# Patient Record
Sex: Male | Born: 1980 | Race: Black or African American | Hispanic: No | Marital: Married | Smoking: Never smoker
Health system: Southern US, Community
[De-identification: ages and names within clinical notes are randomized; demographics above are authoritative.]

## PROBLEM LIST (undated history)

## (undated) DIAGNOSIS — Z227 Latent tuberculosis: Secondary | ICD-10-CM

## (undated) DIAGNOSIS — I10 Essential (primary) hypertension: Secondary | ICD-10-CM

## (undated) DIAGNOSIS — G5600 Carpal tunnel syndrome, unspecified upper limb: Secondary | ICD-10-CM

## (undated) DIAGNOSIS — I313 Pericardial effusion (noninflammatory): Secondary | ICD-10-CM

## (undated) DIAGNOSIS — I3139 Other pericardial effusion (noninflammatory): Secondary | ICD-10-CM

## (undated) DIAGNOSIS — K219 Gastro-esophageal reflux disease without esophagitis: Secondary | ICD-10-CM

## (undated) HISTORY — PX: COLONOSCOPY: SHX174

## (undated) HISTORY — DX: Carpal tunnel syndrome, unspecified upper limb: G56.00

## (undated) HISTORY — PX: PERICARDIOCENTESIS: SHX2215

---

## 2011-01-27 HISTORY — PX: UPPER GASTROINTESTINAL ENDOSCOPY: SHX188

## 2011-10-10 ENCOUNTER — Encounter (HOSPITAL_BASED_OUTPATIENT_CLINIC_OR_DEPARTMENT_OTHER): Payer: Self-pay | Admitting: *Deleted

## 2011-10-10 ENCOUNTER — Emergency Department (HOSPITAL_BASED_OUTPATIENT_CLINIC_OR_DEPARTMENT_OTHER): Payer: 59

## 2011-10-10 ENCOUNTER — Emergency Department (HOSPITAL_BASED_OUTPATIENT_CLINIC_OR_DEPARTMENT_OTHER)
Admission: EM | Admit: 2011-10-10 | Discharge: 2011-10-10 | Disposition: A | Discharge status: Other Acute Inpatient Hospital | Payer: 59 | Attending: Emergency Medicine | Admitting: Emergency Medicine

## 2011-10-10 DIAGNOSIS — I319 Disease of pericardium, unspecified: Secondary | ICD-10-CM | POA: Insufficient documentation

## 2011-10-10 DIAGNOSIS — I313 Pericardial effusion (noninflammatory): Secondary | ICD-10-CM

## 2011-10-10 DIAGNOSIS — IMO0001 Reserved for inherently not codable concepts without codable children: Secondary | ICD-10-CM

## 2011-10-10 DIAGNOSIS — R0602 Shortness of breath: Secondary | ICD-10-CM | POA: Insufficient documentation

## 2011-10-10 LAB — CBC WITH DIFFERENTIAL/PLATELET
Eosinophils Absolute: 0.2 10*3/uL (ref 0.0–0.7)
Eosinophils Relative: 3 % (ref 0–5)
HCT: 34.2 % — ABNORMAL LOW (ref 39.0–52.0)
Lymphocytes Relative: 24 % (ref 12–46)
Lymphs Abs: 1.8 10*3/uL (ref 0.7–4.0)
MCH: 29.7 pg (ref 26.0–34.0)
MCV: 92.4 fL (ref 78.0–100.0)
Monocytes Absolute: 0.9 10*3/uL (ref 0.1–1.0)
Monocytes Relative: 12 % (ref 3–12)
Platelets: 444 10*3/uL — ABNORMAL HIGH (ref 150–400)
RBC: 3.7 MIL/uL — ABNORMAL LOW (ref 4.22–5.81)
WBC: 7.2 10*3/uL (ref 4.0–10.5)

## 2011-10-10 LAB — BASIC METABOLIC PANEL
BUN: 9 mg/dL (ref 6–23)
CO2: 27 mEq/L (ref 19–32)
Calcium: 9.1 mg/dL (ref 8.4–10.5)
Creatinine, Ser: 1 mg/dL (ref 0.50–1.35)
GFR calc non Af Amer: >90 mL/min (ref >90)
Glucose, Bld: 98 mg/dL (ref 70–99)
Sodium: 139 mEq/L (ref 135–145)

## 2011-10-10 LAB — URINALYSIS, ROUTINE W REFLEX MICROSCOPIC
Bilirubin Urine: NEGATIVE
Glucose, UA: NEGATIVE mg/dL
Hgb urine dipstick: NEGATIVE
Specific Gravity, Urine: 1.018 (ref 1.005–1.030)
Urobilinogen, UA: 1 mg/dL (ref 0.0–1.0)

## 2011-10-10 LAB — PHOSPHORUS: Phosphorus: 4.4 mg/dL (ref 2.3–4.6)

## 2011-10-10 MED ORDER — IOHEXOL 300 MG/ML  SOLN
20.0000 mL | INTRAMUSCULAR | Status: AC
  Administered 2011-10-10: 20 mL via ORAL

## 2011-10-10 MED ORDER — SODIUM CHLORIDE 0.9 % IV SOLN: Freq: Once | INTRAVENOUS | Status: DC

## 2011-10-10 MED ORDER — PANTOPRAZOLE SODIUM 40 MG PO TBEC
40.0000 mg | DELAYED_RELEASE_TABLET | Freq: Once | ORAL | Status: AC
  Administered 2011-10-10: 40 mg via ORAL
  Filled 2011-10-10: qty 1

## 2011-10-10 MED ORDER — IOHEXOL 300 MG/ML  SOLN
100.0000 mL | Freq: Once | INTRAMUSCULAR | Status: AC | PRN
  Administered 2011-10-10: 100 mL via INTRAVENOUS

## 2011-10-10 MED ORDER — SODIUM CHLORIDE 0.9 % IV BOLUS (SEPSIS)
1000.0000 mL | Freq: Once | INTRAVENOUS | Status: AC
  Administered 2011-10-10: 1000 mL via INTRAVENOUS

## 2011-10-10 NOTE — ED Provider Notes (Addendum)
History  This chart was scribed for Derwood Kaplan, MD by Shari Heritage. The patient was seen in room MH04/MH04. Patient's care was started at 1606.     CSN: [id number]  Arrival date & time 10/10/11  1503   First MD Initiated Contact with Patient 10/10/11 1606      Chief Complaint  Patient presents with  . Abdominal Pain    Patient is a 31 y.o. male presenting with abdominal pain. The history is provided by the patient. No language interpreter was used.  Abdominal Pain The primary symptoms of the illness include abdominal pain and shortness of breath. The primary symptoms of the illness do not include nausea, vomiting or diarrhea. The current episode started more than 2 days ago. The onset of the illness was sudden. The problem has not changed since onset. The abdominal pain began more than 2 days ago. The pain came on suddenly. The abdominal pain has been unchanged since its onset. The abdominal pain is located in the epigastric region and RUQ. The abdominal pain is exacerbated by eating and movement.  Additional symptoms associated with the illness include constipation. Symptoms associated with the illness do not include chills. Significant associated medical issues do not include gallstones or substance abuse.    Trevor Lewis is a 31 y.o. male who presents to the Emergency Department complaining of intermittent, sharp, epigastric and RUQ abdominal pain onset 6 days ago. There is associated shortness of breath, non-productive cough and constipation. Patient says that the pain is worse with palpation, when he eats and when he walks. Patient says that he feels full when small amounts of food. He denies nausea, vomiting, chills or diarrhea. His last BM was earlier this morning. Patient says that he was seen at [facility] in [place] and worked up for possible gall bladder disease, but all results and scans were normal. He was discharged with prescriptions for Dicyclomine 10 mg, Sucralfate 1  gm, and Omeprazole 40 mg. Patient denies any significant past medical or surgical history. Patient is from [place] and has been in the [place] 8 years. He denies any recent travel to [place] or other countries. Patient does not smoke or drink.  PCP - [facility]  History reviewed. No pertinent past medical history.  History reviewed. No pertinent past surgical history.  History reviewed. No pertinent family history.  History  Substance Use Topics  . Smoking status: Never Smoker   . Smokeless tobacco: Not on file  . Alcohol Use: No      Review of Systems  Constitutional: Negative for chills.  Respiratory: Positive for cough and shortness of breath.   Gastrointestinal: Positive for abdominal pain and constipation. Negative for nausea, vomiting and diarrhea.  All other systems reviewed and are negative.    Allergies  Review of patient's allergies indicates no known allergies.  Home Medications  No current outpatient prescriptions on file.  BP 130/87  Pulse 102  Temp 98.5 F (36.9 C) (Oral)  Resp 20  Ht 5\' 10"  (1.778 m)  Wt 210 lb (95.255 kg)  BMI 30.13 kg/m2  SpO2 98%  Physical Exam  Constitutional: He is oriented to person, place, and time. He appears well-developed and well-nourished.  HENT:  Head: Normocephalic and atraumatic.  Cardiovascular: Normal rate and regular rhythm.   No murmur heard. Pulmonary/Chest: Effort normal and breath sounds normal. No respiratory distress. He has no wheezes. He has no rales.       Lungs clear to auscultation.  Abdominal: Soft. Bowel  sounds are normal. He exhibits no distension. There is no tenderness. There is no rebound.  Musculoskeletal: Normal range of motion.  Neurological: He is alert and oriented to person, place, and time.  Skin: Skin is warm and dry.  Psychiatric: He has a normal mood and affect. His behavior is normal.    ED Course  Procedures (including critical care time) DIAGNOSTIC STUDIES: Oxygen  Saturation is 98% on room air, normal by my interpretation.    COORDINATION OF CARE: 4:21pm- Patient informed of current plan for treatment and evaluation and agrees with plan at this time. Will order X-rays of chest and abdomen. Recommended that patient continue to take prescribed medication. Will refer patient to on-call GI physicians.   Results for orders placed during the hospital encounter of 10/10/11  URINALYSIS, ROUTINE W REFLEX MICROSCOPIC      Component Value Range   Color, Urine YELLOW  YELLOW   APPearance CLEAR  CLEAR   Specific Gravity, Urine 1.018  1.005 - 1.030   pH 6.5  5.0 - 8.0   Glucose, UA NEGATIVE  NEGATIVE mg/dL   Hgb urine dipstick NEGATIVE  NEGATIVE   Bilirubin Urine NEGATIVE  NEGATIVE   Ketones, ur NEGATIVE  NEGATIVE mg/dL   Protein, ur NEGATIVE  NEGATIVE mg/dL   Urobilinogen, UA 1.0  0.0 - 1.0 mg/dL   Nitrite NEGATIVE  NEGATIVE   Leukocytes, UA NEGATIVE  NEGATIVE    No results found.   No diagnosis found.    MDM  Medical screening examination/treatment/procedure(s) were performed by me as the supervising physician. Scribe service was utilized for documentation only.  DDx includes: Pancreatitis Hepatobiliary pathology including cholecystitis Gastritis/PUD SBO ACS syndrome Aortic Dissection  Pt comes in with cc of abd pain. Pain x 1 week, RUQ, constant, worse at night, and when supine. Feels constipated, having BM, passing gas. ] No cancer hx in the family, no risk factors for SBO, or gastritis. Initial impression is gastritis. Abd is distended - pt unable to secure a GI appt for endoscopy at this time, might have to give him our GI team follow up.  5:40 PM  Pt's AAS shows some ileus, pericardial effusion Will get CT now. Pt from [place]. In [place] for 8 years. No recent travels, no hx of TB   Derwood Kaplan, MD 10/10/11 1741  CT Shows no abd pathology, but there is a massive pericardial effusion. Pt's exam shows no muffled heart sounds, no  JVD. He does endorse PND like sx, and some SOB with exertion. Sees physicians out of [facility] [place], and i think he would be an appropriate candidate for admission for further diagnostic purposes.   Date: 10/10/2011  Rate: 98  Rhythm: normal sinus rhythm  QRS Axis: normal  Intervals: normal  ST/T Wave abnormalities: nonspecific ST/T changes  Conduction Disutrbances:none  Narrative Interpretation:   Old EKG Reviewed: none available     Derwood Kaplan, MD 10/10/11 2003

## 2011-10-10 NOTE — ED Notes (Signed)
Pt transferred to [facility] by [facility].

## 2011-10-10 NOTE — ED Notes (Signed)
Pt states he has noticed RUQ pain, SHOB since Sunday. Seen at [facility] and worked up for gall bladder, but everything nrl. Placed on meds, but not helping. Some problems with constipation. No urinary s/s. No N/V/D. Last BM this a.m. S/S worse when lying down and bending over.

## 2011-10-10 NOTE — ED Notes (Signed)
Pt given room assignment @ [facility] per Nursing Supervisor, Boneta Lucks. Room 450

## 2011-10-11 LAB — C-REACTIVE PROTEIN: CRP: 8.7 mg/dL — ABNORMAL HIGH (ref <0.60)

## 2012-12-24 ENCOUNTER — Encounter (HOSPITAL_BASED_OUTPATIENT_CLINIC_OR_DEPARTMENT_OTHER): Payer: Self-pay | Admitting: Emergency Medicine

## 2012-12-24 ENCOUNTER — Emergency Department (HOSPITAL_BASED_OUTPATIENT_CLINIC_OR_DEPARTMENT_OTHER)
Admission: EM | Admit: 2012-12-24 | Discharge: 2012-12-24 | Disposition: A | Discharge status: Home / Self Care | Payer: 59 | Attending: Emergency Medicine | Admitting: Emergency Medicine

## 2012-12-24 DIAGNOSIS — Z7982 Long term (current) use of aspirin: Secondary | ICD-10-CM | POA: Insufficient documentation

## 2012-12-24 DIAGNOSIS — Z8679 Personal history of other diseases of the circulatory system: Secondary | ICD-10-CM | POA: Insufficient documentation

## 2012-12-24 DIAGNOSIS — J329 Chronic sinusitis, unspecified: Secondary | ICD-10-CM | POA: Insufficient documentation

## 2012-12-24 DIAGNOSIS — Z79899 Other long term (current) drug therapy: Secondary | ICD-10-CM | POA: Insufficient documentation

## 2012-12-24 HISTORY — DX: Pericardial effusion (noninflammatory): I31.3

## 2012-12-24 HISTORY — DX: Other pericardial effusion (noninflammatory): I31.39

## 2012-12-24 MED ORDER — HYDROCODONE-ACETAMINOPHEN 5-325 MG PO TABS: 2.0000 | ORAL_TABLET | ORAL | Status: DC | PRN

## 2012-12-24 MED ORDER — AMOXICILLIN-POT CLAVULANATE 875-125 MG PO TABS: 1.0000 | ORAL_TABLET | Freq: Two times a day (BID) | ORAL | Status: DC

## 2012-12-24 NOTE — ED Provider Notes (Signed)
Medical screening examination/treatment/procedure(s) were performed by non-physician practitioner and as supervising physician I was immediately available for consultation/collaboration.  EKG Interpretation   None        Ethelda Chick, MD 12/24/12 1655

## 2012-12-24 NOTE — ED Notes (Signed)
Pt reports HA, nasal congestion and cough x 1 week.

## 2012-12-24 NOTE — ED Provider Notes (Signed)
CSN: [id number]     Arrival date & time 12/24/12  1411 History   First MD Initiated Contact with Patient 12/24/12 1637     Chief Complaint  Patient presents with  . Recurrent Sinusitis   (Consider location/radiation/quality/duration/timing/severity/associated sxs/prior Treatment) Patient is a 32 y.o. male presenting with pharyngitis. The history is provided by the patient. No language interpreter was used.  Sore Throat This is a new problem. The current episode started today. The problem occurs constantly. The problem has been gradually worsening. Associated symptoms include coughing, headaches, a sore throat and swollen glands. Nothing aggravates the symptoms. He has tried nothing for the symptoms. The treatment provided moderate relief.  Pt complains of a cough and sinus pressure for the past week.   Pt comlains of pain to sinuses and face   Past Medical History  Diagnosis Date  . Pericardial effusion    History reviewed. No pertinent past surgical history. History reviewed. No pertinent family history. History  Substance Use Topics  . Smoking status: Never Smoker   . Smokeless tobacco: Not on file  . Alcohol Use: No    Review of Systems  HENT: Positive for sore throat.   Respiratory: Positive for cough.   Neurological: Positive for headaches.  All other systems reviewed and are negative.    Allergies  Review of patient's allergies indicates no known allergies.  Home Medications   Current Outpatient Rx  Name  Route  Sig  Dispense  Refill  . acetaminophen (TYLENOL) 500 MG tablet   Oral   Take 1,000 mg by mouth every 6 (six) hours as needed. For pain         . aspirin 81 MG tablet   Oral   Take 81 mg by mouth daily.         Marland Kitchen ibuprofen (ADVIL,MOTRIN) 200 MG tablet   Oral   Take 400 mg by mouth every 6 (six) hours as needed. For pain         . omeprazole (PRILOSEC) 40 MG capsule   Oral   Take 40 mg by mouth daily.         . sucralfate (CARAFATE) 1 G  tablet   Oral   Take 1 g by mouth 4 (four) times daily.          BP 136/84  Pulse 79  Temp(Src) 98.3 F (36.8 C) (Oral)  Resp 18  Ht 5\' 10"  (1.778 m)  Wt 200 lb (90.719 kg)  BMI 28.70 kg/m2  SpO2 98% Physical Exam  Nursing note and vitals reviewed. Constitutional: He appears well-developed and well-nourished.  HENT:  Head: Normocephalic and atraumatic.  Tender maxillary sinuses  Eyes: Conjunctivae are normal. Pupils are equal, round, and reactive to light.  Neck: Normal range of motion.  Cardiovascular: Normal rate and normal heart sounds.   Pulmonary/Chest: Effort normal and breath sounds normal.  Musculoskeletal: Normal range of motion.  Neurological: He is alert.  Skin: Skin is warm.    ED Course  Procedures (including critical care time) Labs Review Labs Reviewed - No data to display Imaging Review No results found.  EKG Interpretation   None       MDM   1. Sinusitis    Augmentin and hydrocodone   Elson Areas, [place] 12/24/12 1652

## 2013-02-06 ENCOUNTER — Other Ambulatory Visit: Payer: Self-pay

## 2013-02-06 ENCOUNTER — Encounter (HOSPITAL_BASED_OUTPATIENT_CLINIC_OR_DEPARTMENT_OTHER): Payer: Self-pay | Admitting: Emergency Medicine

## 2013-02-06 ENCOUNTER — Emergency Department (HOSPITAL_BASED_OUTPATIENT_CLINIC_OR_DEPARTMENT_OTHER)
Admission: EM | Admit: 2013-02-06 | Discharge: 2013-02-06 | Disposition: A | Discharge status: Home / Self Care | Payer: 59 | Attending: Emergency Medicine | Admitting: Emergency Medicine

## 2013-02-06 ENCOUNTER — Emergency Department (HOSPITAL_BASED_OUTPATIENT_CLINIC_OR_DEPARTMENT_OTHER): Payer: 59

## 2013-02-06 DIAGNOSIS — R079 Chest pain, unspecified: Secondary | ICD-10-CM | POA: Insufficient documentation

## 2013-02-06 DIAGNOSIS — R05 Cough: Secondary | ICD-10-CM | POA: Insufficient documentation

## 2013-02-06 DIAGNOSIS — R509 Fever, unspecified: Secondary | ICD-10-CM | POA: Insufficient documentation

## 2013-02-06 DIAGNOSIS — IMO0001 Reserved for inherently not codable concepts without codable children: Secondary | ICD-10-CM | POA: Insufficient documentation

## 2013-02-06 DIAGNOSIS — R51 Headache: Secondary | ICD-10-CM | POA: Insufficient documentation

## 2013-02-06 DIAGNOSIS — Z7982 Long term (current) use of aspirin: Secondary | ICD-10-CM | POA: Insufficient documentation

## 2013-02-06 DIAGNOSIS — R6889 Other general symptoms and signs: Secondary | ICD-10-CM

## 2013-02-06 DIAGNOSIS — R1110 Vomiting, unspecified: Secondary | ICD-10-CM | POA: Insufficient documentation

## 2013-02-06 DIAGNOSIS — J3489 Other specified disorders of nose and nasal sinuses: Secondary | ICD-10-CM | POA: Insufficient documentation

## 2013-02-06 DIAGNOSIS — R059 Cough, unspecified: Secondary | ICD-10-CM | POA: Insufficient documentation

## 2013-02-06 DIAGNOSIS — Z8679 Personal history of other diseases of the circulatory system: Secondary | ICD-10-CM | POA: Insufficient documentation

## 2013-02-06 DIAGNOSIS — Z79899 Other long term (current) drug therapy: Secondary | ICD-10-CM | POA: Insufficient documentation

## 2013-02-06 MED ORDER — IBUPROFEN 800 MG PO TABS
800.0000 mg | ORAL_TABLET | Freq: Once | ORAL | Status: AC
  Administered 2013-02-06: 800 mg via ORAL
  Filled 2013-02-06: qty 1

## 2013-02-06 NOTE — ED Notes (Signed)
Body aches, fever, prod cough x 3 days

## 2013-02-06 NOTE — ED Provider Notes (Signed)
CSN: [id number]     Arrival date & time 02/06/13  1721 History  This chart was scribed for Trevor CamelScott T Demarcus Thielke, MD by Greggory StallionKayla Andersen, ED Scribe. This patient was seen in room MH02/MH02 and the patient's care was started at 5:53 PM.   Chief Complaint  Patient presents with  . URI   The history is provided by the patient. No language interpreter was used.   HPI Comments: Ludwig LeanRomain Lewis is a 33 y.o. male who presents to the Emergency Department complaining of generalized body aches, fever, rhinorrhea, congestion, headache and productive cough that started 2 days ago. The highest his fever has been is 103. Pt states he is having mild chest pain that is worsened with coughing. He had an episode of emesis this morning but denies nausea currently. He has taken ibuprofen with little relief. Denies dysuria. His wife has been sick with similar symptoms. Pt did not get a flu shot this year. He has history of pericardial effusion that was drained with a catheter.   Past Medical History  Diagnosis Date  . Pericardial effusion    History reviewed. No pertinent past surgical history. No family history on file. History  Substance Use Topics  . Smoking status: Never Smoker   . Smokeless tobacco: Not on file  . Alcohol Use: No    Review of Systems  Constitutional: Positive for fever.  HENT: Positive for congestion and rhinorrhea.   Respiratory: Positive for cough.   Cardiovascular: Positive for chest pain.  Gastrointestinal: Positive for vomiting. Negative for nausea.  Genitourinary: Negative for dysuria.  Musculoskeletal: Positive for myalgias.  Neurological: Positive for headaches.  All other systems reviewed and are negative.    Allergies  Review of patient's allergies indicates no known allergies.  Home Medications   Current Outpatient Rx  Name  Route  Sig  Dispense  Refill  . acetaminophen (TYLENOL) 500 MG tablet   Oral   Take 1,000 mg by mouth every 6 (six) hours as needed. For pain        . amoxicillin-clavulanate (AUGMENTIN) 875-125 MG per tablet   Oral   Take 1 tablet by mouth every 12 (twelve) hours.   20 tablet   0   . aspirin 81 MG tablet   Oral   Take 81 mg by mouth daily.         Marland Kitchen. HYDROcodone-acetaminophen (NORCO/VICODIN) 5-325 MG per tablet   Oral   Take 2 tablets by mouth every 4 (four) hours as needed.   20 tablet   0   . ibuprofen (ADVIL,MOTRIN) 200 MG tablet   Oral   Take 400 mg by mouth every 6 (six) hours as needed. For pain         . omeprazole (PRILOSEC) 40 MG capsule   Oral   Take 40 mg by mouth daily.         . sucralfate (CARAFATE) 1 G tablet   Oral   Take 1 g by mouth 4 (four) times daily.          BP 151/96  Pulse 87  Temp(Src) 99.3 F (37.4 C) (Oral)  Ht 5\' 11"  (1.803 m)  Wt 201 lb (91.173 kg)  BMI 28.05 kg/m2  SpO2 99%  Physical Exam  Nursing note and vitals reviewed. Constitutional: He is oriented to person, place, and time. He appears well-developed and well-nourished. No distress.  HENT:  Head: Normocephalic and atraumatic.  Eyes: EOM are normal.  Neck: Neck supple. No tracheal deviation present.  Cardiovascular: Normal rate, regular rhythm and normal heart sounds.   No murmur heard. Pulmonary/Chest: Effort normal and breath sounds normal. No respiratory distress. He has no wheezes. He has no rales.  Abdominal: Soft. He exhibits no distension. There is no tenderness.  Musculoskeletal: Normal range of motion.  Neurological: He is alert and oriented to person, place, and time.  Skin: Skin is warm and dry.  Psychiatric: He has a normal mood and affect. His behavior is normal.    ED Course  Procedures (including critical care time)  DIAGNOSTIC STUDIES: Oxygen Saturation is 99% on RA, normal by my interpretation.    COORDINATION OF CARE: 5:57 PM-Discussed treatment plan which includes chest xray with pt at bedside and pt agreed to plan.   Labs Review Labs Reviewed - No data to display Imaging  Review Dg Chest 2 View  02/06/2013   CLINICAL DATA:  Cough.  Fever. Shortness of breath.  EXAM: CHEST  2 VIEW  COMPARISON:  CT ABD/PELVIS W CM dated 10/10/2011  FINDINGS: Cardiopericardial silhouette size within normal limits. There is potentially a trace amount of pericardial fluid anteriorly shown on the lateral projection. The lungs appear clear. No pleural effusion.  IMPRESSION: 1. Trace anterior pericardial effusion is suggested based on the lateral projection. The cardiopericardial silhouette size is within normal limits and the lungs appear clear.   Electronically Signed   By: Trevor Lewis M.D.   On: 02/06/2013 18:25    EKG Interpretation   None       Date: 02/07/2013  Rate: 94  Rhythm: normal sinus rhythm  QRS Axis: left  Intervals: normal  ST/T Wave abnormalities: nonspecific ST/T changes  Conduction Disutrbances:none  Narrative Interpretation: new LAD  Old EKG Reviewed: changes noted   MDM   1. Flu-like symptoms    Patient with flu like symptoms for several days. Appears well otherwise, has benign exam. CXR shows possible trace pericardial effusion, only seen on one view. Heart is otherwise normal size and patient appears well. Has a PCP he can seen in [facility]. D/w Dr. Diona Browner (cards on call), who states that since he is not ill appearing and this is questionable for him to just get an outpatient echo with PCP. Discussed this with patient, who understands return precautions. Discussed symptomatic care for his viral illness.  I personally performed the services described in this documentation, which was scribed in my presence. The recorded information has been reviewed and is accurate.   Trevor Camel, MD 02/07/13 [phone]

## 2013-02-06 NOTE — Discharge Instructions (Signed)
Your symptoms are most consistent with the flu. On your Chest X-ray there was a possible small amount of fluid around your heart. At this time you need to follow up with your doctor for an outpatient echochardiogram. If you symptoms worsen in any way or you develop severe chest pain or shortness of breath you should return to the ER as soon as possible.

## 2013-07-15 ENCOUNTER — Emergency Department (HOSPITAL_BASED_OUTPATIENT_CLINIC_OR_DEPARTMENT_OTHER)
Admission: EM | Admit: 2013-07-15 | Discharge: 2013-07-15 | Disposition: A | Discharge status: Home / Self Care | Payer: 59 | Attending: Emergency Medicine | Admitting: Emergency Medicine

## 2013-07-15 ENCOUNTER — Emergency Department (HOSPITAL_BASED_OUTPATIENT_CLINIC_OR_DEPARTMENT_OTHER): Payer: 59

## 2013-07-15 ENCOUNTER — Encounter (HOSPITAL_BASED_OUTPATIENT_CLINIC_OR_DEPARTMENT_OTHER): Payer: Self-pay | Admitting: Emergency Medicine

## 2013-07-15 DIAGNOSIS — Z7982 Long term (current) use of aspirin: Secondary | ICD-10-CM | POA: Insufficient documentation

## 2013-07-15 DIAGNOSIS — Z79899 Other long term (current) drug therapy: Secondary | ICD-10-CM | POA: Insufficient documentation

## 2013-07-15 DIAGNOSIS — Y9289 Other specified places as the place of occurrence of the external cause: Secondary | ICD-10-CM | POA: Insufficient documentation

## 2013-07-15 DIAGNOSIS — S61409A Unspecified open wound of unspecified hand, initial encounter: Secondary | ICD-10-CM | POA: Insufficient documentation

## 2013-07-15 DIAGNOSIS — Z792 Long term (current) use of antibiotics: Secondary | ICD-10-CM | POA: Insufficient documentation

## 2013-07-15 DIAGNOSIS — I10 Essential (primary) hypertension: Secondary | ICD-10-CM | POA: Insufficient documentation

## 2013-07-15 DIAGNOSIS — S61441A Puncture wound with foreign body of right hand, initial encounter: Secondary | ICD-10-CM

## 2013-07-15 DIAGNOSIS — Z791 Long term (current) use of non-steroidal anti-inflammatories (NSAID): Secondary | ICD-10-CM | POA: Insufficient documentation

## 2013-07-15 DIAGNOSIS — K219 Gastro-esophageal reflux disease without esophagitis: Secondary | ICD-10-CM | POA: Insufficient documentation

## 2013-07-15 DIAGNOSIS — Y9389 Activity, other specified: Secondary | ICD-10-CM | POA: Insufficient documentation

## 2013-07-15 DIAGNOSIS — W268XXA Contact with other sharp object(s), not elsewhere classified, initial encounter: Secondary | ICD-10-CM | POA: Insufficient documentation

## 2013-07-15 HISTORY — DX: Latent tuberculosis: Z22.7

## 2013-07-15 HISTORY — DX: Essential (primary) hypertension: I10

## 2013-07-15 HISTORY — DX: Gastro-esophageal reflux disease without esophagitis: K21.9

## 2013-07-15 MED ORDER — CEPHALEXIN 250 MG PO CAPS
1000.0000 mg | ORAL_CAPSULE | Freq: Once | ORAL | Status: AC
  Administered 2013-07-15: 1000 mg via ORAL
  Filled 2013-07-15: qty 4

## 2013-07-15 MED ORDER — HYDROCODONE-ACETAMINOPHEN 5-325 MG PO TABS: 1.0000 | ORAL_TABLET | Freq: Four times a day (QID) | ORAL | Status: DC | PRN

## 2013-07-15 MED ORDER — CEPHALEXIN 500 MG PO CAPS: 500.0000 mg | ORAL_CAPSULE | Freq: Four times a day (QID) | ORAL | Status: DC

## 2013-07-15 NOTE — ED Notes (Signed)
Pt reports he accidentally hit hand against glass mirror in the bathroom this morning. Pt reports glass was shattered prior to this event. Pt has laceration to right hand approx 2 cm x 1cm - bleeding controlled at this time. Slight edema noted to site.

## 2013-07-15 NOTE — ED Provider Notes (Signed)
CSN: [id number]     Arrival date & time 07/15/13  [id number] History   First MD Initiated Contact with Patient 07/15/13 [phone]     Chief Complaint  Patient presents with  . Hand Injury     (Consider location/radiation/quality/duration/timing/severity/associated sxs/prior Treatment) HPI This is a 33 year old male who went to the bathroom just prior to arrival. There was a broken mirror in the bathroom and he struck his hand against the mirror. He states that there is a shard of glass in the dorsum of his right hand. The glass went in through a laceration near the thumb and is lying transversely under the skin of the hand. There is moderate pain associated with this, worse with palpation. There is no distal weakness or functional deficit. He states there is some numbness of the fingers distal to the wound. He had his last tetanus shot about a year ago per  Past Medical History  Diagnosis Date  . Pericardial effusion   . Hypertension   . TB lung, latent   . GERD (gastroesophageal reflux disease)    History reviewed. No pertinent past surgical history. History reviewed. No pertinent family history. History  Substance Use Topics  . Smoking status: Never Smoker   . Smokeless tobacco: Not on file  . Alcohol Use: No    Review of Systems  All other systems reviewed and are negative.  Allergies  Review of patient's allergies indicates no known allergies.  Home Medications   Prior to Admission medications   Medication Sig Start Date End Date Taking? Authorizing Marek Nghiem  acetaminophen (TYLENOL) 500 MG tablet Take 1,000 mg by mouth every 6 (six) hours as needed. For pain   Yes Historical Jazyiah Yiu, MD  aspirin 81 MG tablet Take 81 mg by mouth daily.   Yes Historical Ernestine Rohman, MD  ibuprofen (ADVIL,MOTRIN) 200 MG tablet Take 400 mg by mouth every 6 (six) hours as needed. For pain   Yes Historical Janna Oak, MD  omeprazole (PRILOSEC) 40 MG capsule Take 40 mg by mouth daily.   Yes Historical  Randal Goens, MD  amoxicillin-clavulanate (AUGMENTIN) 875-125 MG per tablet Take 1 tablet by mouth every 12 (twelve) hours. 12/24/12   Elson AreasLeslie K Sofia, PA-C  HYDROcodone-acetaminophen (NORCO/VICODIN) 5-325 MG per tablet Take 2 tablets by mouth every 4 (four) hours as needed. 12/24/12   Elson AreasLeslie K Sofia, PA-C  sucralfate (CARAFATE) 1 G tablet Take 1 g by mouth 4 (four) times daily.    Historical Audi Wettstein, MD   BP 139/89  Pulse 80  Temp(Src) 97.8 F (36.6 C) (Oral)  Ht 5\' 11"  (1.803 m)  Wt 208 lb (94.348 kg)  BMI 29.02 kg/m2  SpO2 98%  Physical Exam General: Well-developed, well-nourished male in no acute distress; appearance consistent with age of record HENT: normocephalic; atraumatic Eyes: pupils equal, round and reactive to light; extraocular muscles intact Neck: supple Heart: regular rate and rhythm Lungs: clear to auscultation bilaterally Abdomen: soft; nondistended; nontender; no masses or hepatosplenomegaly; bowel sounds present Extremities: No deformity; full range of motion; pulses normal; small laceration dorsal right hand with palpable foreign body lying transversely under the skin approximately 3 cm in length; right hand distally vascularly intact with intact tendon function, mild numbness of dorsal fingers distal to the wound Neurologic: Awake, alert and oriented; motor function intact in all extremities and symmetric; no facial droop Skin: Warm and dry Psychiatric: Normal mood and affect    ED Course  Procedures (including critical care time)  LACERATION REPAIR Performed by: Hanley SeamenMOLPUS,JOHN L Authorized  by: Hanley SeamenMOLPUS,JOHN L Consent: Verbal consent obtained. Risks and benefits: risks, benefits and alternatives were discussed Consent given by: patient Patient identity confirmed: provided demographic data Prepped and Draped in normal sterile fashion  Location: Dorsal right hand Laceration Length: 0.5 cm  Anesthesia: local infiltration  Local anesthetic: lidocaine 2 % with  epinephrine  Anesthetic total: 4 ml  Wound explored; 2 shards of glass removed, approximately 3 cm in length as shown:  Following removal the wound was again explored and no additional foreign bodies were palpated. A small stab wound was made at the distal end of the tract of the foreign bodies. The wound tract was irrigated with normal saline, with a syringe and angiocath, through the stab wound.  Skin closure: Not indicated due to potential for infection    Patient tolerance: Patient tolerated the procedure well with no immediate complications.   MDM  Nursing notes and vitals signs, including pulse oximetry, reviewed.  Summary of this visit's results, reviewed by myself:  Labs:  No results found for this or any previous visit (from the past 24 hour(s)).  Imaging Studies: Dg Hand 2 View Right  07/15/2013   CLINICAL DATA:  Hit right hand against glass, with laceration to the right hand.  EXAM: RIGHT HAND - 2 VIEW  COMPARISON:  None.  FINDINGS: There is no evidence of fracture or dislocation. The joint spaces are preserved; the known soft tissue laceration is only minimally characterized along the dorsum of the wrist. No radiopaque foreign bodies are seen. The carpal rows are intact, and demonstrate normal alignment.  IMPRESSION: No evidence of fracture or dislocation. No radiopaque foreign bodies seen.   Electronically Signed   By: Roanna RaiderJeffery  Chang M.D.   On: 07/15/2013 05:49        Hanley SeamenJohn L Molpus, MD 07/15/13 [phone]

## 2014-01-08 ENCOUNTER — Encounter (HOSPITAL_BASED_OUTPATIENT_CLINIC_OR_DEPARTMENT_OTHER): Payer: Self-pay | Admitting: *Deleted

## 2014-01-08 ENCOUNTER — Emergency Department (HOSPITAL_BASED_OUTPATIENT_CLINIC_OR_DEPARTMENT_OTHER)
Admission: EM | Admit: 2014-01-08 | Discharge: 2014-01-08 | Disposition: A | Discharge status: Home / Self Care | Payer: 59 | Attending: Emergency Medicine | Admitting: Emergency Medicine

## 2014-01-08 ENCOUNTER — Emergency Department (HOSPITAL_BASED_OUTPATIENT_CLINIC_OR_DEPARTMENT_OTHER): Payer: 59

## 2014-01-08 DIAGNOSIS — Z8611 Personal history of tuberculosis: Secondary | ICD-10-CM | POA: Diagnosis not present

## 2014-01-08 DIAGNOSIS — K219 Gastro-esophageal reflux disease without esophagitis: Secondary | ICD-10-CM | POA: Diagnosis not present

## 2014-01-08 DIAGNOSIS — Z7982 Long term (current) use of aspirin: Secondary | ICD-10-CM | POA: Diagnosis not present

## 2014-01-08 DIAGNOSIS — M79602 Pain in left arm: Secondary | ICD-10-CM | POA: Insufficient documentation

## 2014-01-08 DIAGNOSIS — Z792 Long term (current) use of antibiotics: Secondary | ICD-10-CM | POA: Insufficient documentation

## 2014-01-08 DIAGNOSIS — M542 Cervicalgia: Secondary | ICD-10-CM | POA: Diagnosis not present

## 2014-01-08 DIAGNOSIS — Z79899 Other long term (current) drug therapy: Secondary | ICD-10-CM | POA: Insufficient documentation

## 2014-01-08 DIAGNOSIS — I10 Essential (primary) hypertension: Secondary | ICD-10-CM | POA: Insufficient documentation

## 2014-01-08 LAB — CBC WITH DIFFERENTIAL/PLATELET
BASOS PCT: 0 % (ref 0–1)
Basophils Absolute: 0 10*3/uL (ref 0.0–0.1)
Eosinophils Absolute: 0.1 10*3/uL (ref 0.0–0.7)
Eosinophils Relative: 1 % (ref 0–5)
HEMATOCRIT: 48.2 % (ref 39.0–52.0)
Hemoglobin: 16.5 g/dL (ref 13.0–17.0)
Lymphocytes Relative: 51 % — ABNORMAL HIGH (ref 12–46)
Lymphs Abs: 2.1 10*3/uL (ref 0.7–4.0)
MCH: 31 pg (ref 26.0–34.0)
MCHC: 34.2 g/dL (ref 30.0–36.0)
MCV: 90.6 fL (ref 78.0–100.0)
MONO ABS: 0.4 10*3/uL (ref 0.1–1.0)
Monocytes Relative: 9 % (ref 3–12)
NEUTROS ABS: 1.7 10*3/uL (ref 1.7–7.7)
Neutrophils Relative %: 39 % — ABNORMAL LOW (ref 43–77)
PLATELETS: 297 10*3/uL (ref 150–400)
RBC: 5.32 MIL/uL (ref 4.22–5.81)
RDW: 11.9 % (ref 11.5–15.5)
WBC: 4.3 10*3/uL (ref 4.0–10.5)

## 2014-01-08 LAB — BASIC METABOLIC PANEL
Anion gap: 13 (ref 5–15)
BUN: 15 mg/dL (ref 6–23)
CO2: 25 mEq/L (ref 19–32)
CREATININE: 0.9 mg/dL (ref 0.50–1.35)
Calcium: 9.3 mg/dL (ref 8.4–10.5)
Chloride: 102 mEq/L (ref 96–112)
GFR calc non Af Amer: >90 mL/min (ref >90)
Glucose, Bld: 97 mg/dL (ref 70–99)
Potassium: 4.3 mEq/L (ref 3.7–5.3)
SODIUM: 140 meq/L (ref 137–147)

## 2014-01-08 LAB — TROPONIN I

## 2014-01-08 MED ORDER — CYCLOBENZAPRINE HCL 10 MG PO TABS: 10.0000 mg | ORAL_TABLET | Freq: Two times a day (BID) | ORAL | Status: DC | PRN

## 2014-01-08 MED ORDER — NAPROXEN 500 MG PO TABS: 500.0000 mg | ORAL_TABLET | Freq: Two times a day (BID) | ORAL | Status: DC

## 2014-01-08 NOTE — ED Notes (Signed)
Pt declines w/c, amb to triage 2 with quick steady gait laughing in nad. Pt reports a burning sensation to his left arm yesterday, "like my vein is on fire". Pt states he also has left lateral neck pain, and "a needlestick pain in the chest sometimes". Pt has increased pain with palpation and moving left arm.

## 2014-01-08 NOTE — ED Notes (Signed)
Patient transported to Ultrasound 

## 2014-01-08 NOTE — Discharge Instructions (Signed)
Rest the next 2 days. Take medications as directed. Would recommend following [place] with [place] doctors [place] if symptoms persist. Today's evaluation without significant findings.

## 2014-01-08 NOTE — ED Provider Notes (Signed)
CSN: [id number]     Arrival date & time 01/08/14  1106 History   First MD Initiated Contact with Patient 01/08/14 1133     Chief Complaint  Patient presents with  . Arm Pain     (Consider location/radiation/quality/duration/timing/severity/associated sxs/prior Treatment) Patient is a 33 y.o. male presenting with arm pain. The history is provided by the patient.  Arm Pain Associated symptoms include chest pain. Pertinent negatives include no abdominal pain, no headaches and no shortness of breath.   patient with complaint of left arm pain with a burning tingling sensation on the medial aspect of the arm near the elbow. Associated with some neck pain and shoulder pain that radiates down to the elbow and also into the left side of the chest. Has stated symptoms started yesterday afternoon. Seem to be worse today but have been constant. Second needle stick pain feeling in the chest and like a burning sensation in the left arm. Patient has a history of prior cardial effusion in the past as been followed by [facility]. Patient also noted some swelling of his left forearm with distention of the veins in that area. One week ago patient had a burning sensation in the low part of his back. Patient has no numbness or weakness to his left fingers. Discomfort is about a 5 out of 10.  Past Medical History  Diagnosis Date  . Pericardial effusion   . Hypertension   . TB lung, latent   . GERD (gastroesophageal reflux disease)    History reviewed. No pertinent past surgical history. History reviewed. No pertinent family history. History  Substance Use Topics  . Smoking status: Never Smoker   . Smokeless tobacco: Not on file  . Alcohol Use: No    Review of Systems  Constitutional: Negative for fever.  HENT: Negative for congestion.   Eyes: Negative for redness and visual disturbance.  Respiratory: Negative for shortness of breath.   Cardiovascular: Positive for chest pain. Negative for palpitations  and leg swelling.  Gastrointestinal: Negative for nausea, vomiting and abdominal pain.  Genitourinary: Negative for dysuria.  Musculoskeletal: Positive for back pain and neck pain.  Skin: Negative for rash.  Neurological: Positive for numbness. Negative for weakness and headaches.  Hematological: Does not bruise/bleed easily.  Psychiatric/Behavioral: Negative for confusion.      Allergies  Review of patient's allergies indicates no known allergies.  Home Medications   Prior to Admission medications   Medication Sig Start Date End Date Taking? Authorizing Provider  acetaminophen (TYLENOL) 500 MG tablet Take 1,000 mg by mouth every 6 (six) hours as needed. For pain    Historical Provider, MD  amoxicillin-clavulanate (AUGMENTIN) 875-125 MG per tablet Take 1 tablet by mouth every 12 (twelve) hours. 12/24/12   Elson Areas, PA-C  aspirin 81 MG tablet Take 81 mg by mouth daily.    Historical Provider, MD  cephALEXin (KEFLEX) 500 MG capsule Take 1 capsule (500 mg total) by mouth 4 (four) times daily. 07/15/13   Carlisle Beers Molpus, MD  cyclobenzaprine (FLEXERIL) 10 MG tablet Take 1 tablet (10 mg total) by mouth 2 (two) times daily as needed for muscle spasms. 01/08/14   Vanetta Mulders, MD  HYDROcodone-acetaminophen (NORCO) 5-325 MG per tablet Take 1-2 tablets by mouth every 6 (six) hours as needed (for pain). 07/15/13   Carlisle Beers Molpus, MD  HYDROcodone-acetaminophen (NORCO/VICODIN) 5-325 MG per tablet Take 2 tablets by mouth every 4 (four) hours as needed. 12/24/12   Elson Areas, PA-C  ibuprofen (  ADVIL,MOTRIN) 200 MG tablet Take 400 mg by mouth every 6 (six) hours as needed. For pain    Historical Provider, MD  naproxen (NAPROSYN) 500 MG tablet Take 1 tablet (500 mg total) by mouth 2 (two) times daily. 01/08/14   Vanetta MuldersScott Galen Russman, MD  omeprazole (PRILOSEC) 40 MG capsule Take 40 mg by mouth daily.    Historical Provider, MD  sucralfate (CARAFATE) 1 G tablet Take 1 g by mouth 4 (four) times daily.     Historical Provider, MD   BP 140/100 mmHg  Pulse 67  Temp(Src) 97.8 F (36.6 C) (Oral)  Resp 18  SpO2 98% Physical Exam  Constitutional: He is oriented to person, place, and time. He appears well-developed and well-nourished. No distress.  HENT:  Head: Normocephalic and atraumatic.  Mouth/Throat: Oropharynx is clear and moist.  Eyes: Conjunctivae and EOM are normal. Pupils are equal, round, and reactive to light.  Neck: Normal range of motion. Neck supple.  Cardiovascular: Normal rate, regular rhythm and normal heart sounds.   No murmur heard. Pulmonary/Chest: Effort normal and breath sounds normal. No respiratory distress.  Abdominal: Soft. Bowel sounds are normal. There is no tenderness.  Musculoskeletal: Normal range of motion. He exhibits no edema or tenderness.  Neurological: He is alert and oriented to person, place, and time. No cranial nerve deficit. He exhibits normal muscle tone. Coordination normal.  Skin: Skin is warm. No rash noted.  Nursing note and vitals reviewed.   ED Course  Procedures (including critical care time) Labs Review Labs Reviewed  CBC WITH DIFFERENTIAL - Abnormal; Notable for the following:    Neutrophils Relative % 39 (*)    Lymphocytes Relative 51 (*)    All other components within normal limits  TROPONIN I  BASIC METABOLIC PANEL   Results for orders placed or performed during the hospital encounter of 01/08/14  Troponin I  Result Value Ref Range   Troponin I <0.30 <0.30 ng/mL  CBC with Differential  Result Value Ref Range   WBC 4.3 4.0 - 10.5 K/uL   RBC 5.32 4.22 - 5.81 MIL/uL   Hemoglobin 16.5 13.0 - 17.0 g/dL   HCT 91.448.2 78.239.0 - 95.652.0 %   MCV 90.6 78.0 - 100.0 fL   MCH 31.0 26.0 - 34.0 pg   MCHC 34.2 30.0 - 36.0 g/dL   RDW 21.311.9 08.611.5 - 57.815.5 %   Platelets 297 150 - 400 K/uL   Neutrophils Relative % 39 (L) 43 - 77 %   Neutro Abs 1.7 1.7 - 7.7 K/uL   Lymphocytes Relative 51 (H) 12 - 46 %   Lymphs Abs 2.1 0.7 - 4.0 K/uL   Monocytes  Relative 9 3 - 12 %   Monocytes Absolute 0.4 0.1 - 1.0 K/uL   Eosinophils Relative 1 0 - 5 %   Eosinophils Absolute 0.1 0.0 - 0.7 K/uL   Basophils Relative 0 0 - 1 %   Basophils Absolute 0.0 0.0 - 0.1 K/uL  Basic metabolic panel  Result Value Ref Range   Sodium 140 137 - 147 mEq/L   Potassium 4.3 3.7 - 5.3 mEq/L   Chloride 102 96 - 112 mEq/L   CO2 25 19 - 32 mEq/L   Glucose, Bld 97 70 - 99 mg/dL   BUN 15 6 - 23 mg/dL   Creatinine, Ser 4.690.90 0.50 - 1.35 mg/dL   Calcium 9.3 8.4 - 62.910.5 mg/dL   GFR calc non Af Amer >90 >90 mL/min   GFR calc Af Amer >90 >  90 mL/min   Anion gap 13 5 - 15     Imaging Review Dg Chest 2 View  01/08/2014   CLINICAL DATA:  Left arm pain.  Chest pain  EXAM: CHEST  2 VIEW  COMPARISON:  02/06/2013  FINDINGS: The heart size and mediastinal contours are within normal limits. Both lungs are clear. The visualized skeletal structures are unremarkable.  IMPRESSION: No active cardiopulmonary disease.   Electronically Signed   By: Marlan Palauharles  Clark M.D.   On: 01/08/2014 12:48   [place] Venous Img Upper Uni Left  01/08/2014   CLINICAL DATA:  33 year old male with left forearm pain and swelling for the past week. No known injury. Developing left chest pain today as well.  EXAM: LEFT UPPER EXTREMITY VENOUS DOPPLER ULTRASOUND  TECHNIQUE: Gray-scale sonography with graded compression, as well as color Doppler and duplex ultrasound were performed to evaluate the upper extremity deep venous system from the level of the subclavian vein and including the jugular, axillary, basilic, radial, ulnar and upper cephalic vein. Spectral Doppler was utilized to evaluate flow at rest and with distal augmentation maneuvers.  COMPARISON:  Concurrently obtained chest x-ray  FINDINGS: Contralateral Subclavian Vein: Respiratory phasicity is normal and symmetric with the symptomatic side. No evidence of thrombus. Normal compressibility.  Internal Jugular Vein: No evidence of thrombus. Normal compressibility,  respiratory phasicity and response to augmentation.  Subclavian Vein: No evidence of thrombus. Normal compressibility, respiratory phasicity and response to augmentation.  Axillary Vein: No evidence of thrombus. Normal compressibility, respiratory phasicity and response to augmentation.  Cephalic Vein: No evidence of thrombus. Normal compressibility, respiratory phasicity and response to augmentation.  Basilic Vein: No evidence of thrombus. Normal compressibility, respiratory phasicity and response to augmentation.  Brachial Veins: No evidence of thrombus. Normal compressibility, respiratory phasicity and response to augmentation.  Radial Veins: No evidence of thrombus. Normal compressibility, respiratory phasicity and response to augmentation.  Ulnar Veins: No evidence of thrombus. Normal compressibility, respiratory phasicity and response to augmentation.  Venous Reflux:  None visualized.  Other Findings:  None visualized.  IMPRESSION: No evidence of deep venous thrombosis.   Electronically Signed   By: Malachy MoanHeath  McCullough M.D.   On: 01/08/2014 13:50     EKG Interpretation   Date/Time:  Monday January 08 2014 11:20:40 EST Ventricular Rate:  69 PR Interval:  138 QRS Duration: 88 QT Interval:  384 QTC Calculation: 411 R Axis:   -23 Text Interpretation:  Normal sinus rhythm Possible Anterior infarct , age  undetermined Abnormal ECG No significant change since last tracing  Confirmed by Osmin Welz  MD, Justa Hatchell [phone] on 01/08/2014 11:38:05 AM      MDM   Final diagnoses:  Arm pain, diffuse, left   Clinically and by history seem to be symptoms consistent with nerve irritation with the nerves coming out of the left side of his neck to his arm. The patient had concerns about blood clot and also concerns about his heart. Pain did radiate into the left side of the chest some. Symptoms started yesterday seemed to get worse today. No direct injury. About a week ago did have some discomfort in the low part  of the back. With some burning sensation there.  Workup here negative for any acute cardiac event. EKG without acute changes troponin was negative. Chest x-ray negative for pneumonia pneumothorax pulmonary edema. No signs of an enlarged heart. Doppler studies of the left arm show no evidence of any deep vein thrombosis. Will treat with anti-inflammatories and muscle relaxers and  rest.    Vanetta Mulders, MD 01/08/14 [id number]

## 2014-07-08 ENCOUNTER — Emergency Department (HOSPITAL_BASED_OUTPATIENT_CLINIC_OR_DEPARTMENT_OTHER)
Admission: EM | Admit: 2014-07-08 | Discharge: 2014-07-08 | Disposition: A | Discharge status: Home / Self Care | Payer: 59 | Attending: Emergency Medicine | Admitting: Emergency Medicine

## 2014-07-08 ENCOUNTER — Encounter (HOSPITAL_BASED_OUTPATIENT_CLINIC_OR_DEPARTMENT_OTHER): Payer: Self-pay | Admitting: Adult Health

## 2014-07-08 DIAGNOSIS — Z79899 Other long term (current) drug therapy: Secondary | ICD-10-CM | POA: Diagnosis not present

## 2014-07-08 DIAGNOSIS — Z8611 Personal history of tuberculosis: Secondary | ICD-10-CM | POA: Insufficient documentation

## 2014-07-08 DIAGNOSIS — K219 Gastro-esophageal reflux disease without esophagitis: Secondary | ICD-10-CM | POA: Diagnosis not present

## 2014-07-08 DIAGNOSIS — I10 Essential (primary) hypertension: Secondary | ICD-10-CM | POA: Diagnosis not present

## 2014-07-08 DIAGNOSIS — Z792 Long term (current) use of antibiotics: Secondary | ICD-10-CM | POA: Insufficient documentation

## 2014-07-08 DIAGNOSIS — R51 Headache: Secondary | ICD-10-CM | POA: Diagnosis present

## 2014-07-08 DIAGNOSIS — Z7982 Long term (current) use of aspirin: Secondary | ICD-10-CM | POA: Insufficient documentation

## 2014-07-08 DIAGNOSIS — K0510 Chronic gingivitis, plaque induced: Secondary | ICD-10-CM | POA: Insufficient documentation

## 2014-07-08 DIAGNOSIS — Z8709 Personal history of other diseases of the respiratory system: Secondary | ICD-10-CM | POA: Diagnosis not present

## 2014-07-08 MED ORDER — HYDROCODONE-ACETAMINOPHEN 5-325 MG PO TABS: 2.0000 | ORAL_TABLET | ORAL | Status: DC | PRN

## 2014-07-08 MED ORDER — PENICILLIN V POTASSIUM 500 MG PO TABS: 500.0000 mg | ORAL_TABLET | Freq: Four times a day (QID) | ORAL | Status: AC

## 2014-07-08 MED ORDER — CHLORHEXIDINE GLUCONATE 0.12 % MT SOLN: 15.0000 mL | Freq: Two times a day (BID) | OROMUCOSAL | Status: DC

## 2014-07-08 NOTE — ED Notes (Signed)
Presents with headache that began yesterday AM at 9 am, headache is constant and gradually has gotten worse-pt reports pain in teeth and gums and nose as well-denies light sensitivity-endorses sound sensitivity and intermittent dizziness-denies nausesa. Pt is alert and roeiented, no facial droopo, no slurred speech, no arm drift-denies numbness and tingling. BP 141/96

## 2014-07-08 NOTE — Discharge Instructions (Signed)
Gingivitis °Gingivitis is a form of gum (periodontal) disease that causes redness, soreness, and swelling (inflammation) of your gums. °CAUSES °The most common cause of gingivitis is poor oral hygiene. A sticky substance made of bacteria, mucus, and food particles (plaque), is deposited on the exposed part of teeth. As plaque builds up, it reacts with the saliva in your mouth to form something called  tartar. Tartar is a hard deposit that becomes trapped around the base of the tooth. Plaque and tartar irritate the gums, leading to the formation of gingivitis. Other factors that increase your risk for gingivitis include:  °· Tobacco use. °· Diabetes. °· Older age. °· Certain medications. °· Certain viral or fungal infections. °· Dry mouth. °· Hormonal changes such as during pregnancy. °· Poor nutrition. °· Substance abuse. °· Poor fitting dental restorations or appliances. °SYMPTOMS °You may notice inflammation of the soft tissue (gingiva) around the teeth. When these tissues become inflamed, they bleed easily, especially during flossing or brushing. The gums may also be:  °· Tender to the touch. °· Bright red, purple red, or have a shiny appearance. °· Swollen. °· Wearing away from the teeth (receding), which exposes more of the tooth. °Bad breath is often present. Continued infection around teeth can eventually cause cavities and loosen teeth. This may lead to eventual tooth loss. °DIAGNOSIS °A medical and dental history will be taken. Your mouth, teeth, and gums will be examined. Your dentist will look for soft, swollen purple-red, irritated gums. There may be deposits of plaque and tartar at the base of the teeth. Your gums will be looked at for the degree of redness, puffiness, and bleeding tendencies. Your dentist will see if any of the teeth are loose. X-rays may be taken to see if the inflammation has spread to the supporting structures of the teeth. °TREATMENT °The goal is to reduce and reverse the  inflammation. Proper treatment can usually reverse the symptoms of gingivitis and prevent further progression of the disease. Have your teeth cleaned. During the cleaning, all plaque and tartar will be removed. Instruction for proper home care will be given. You will need regular professional cleanings and check-ups in the future. °HOME CARE INSTRUCTIONS °· Brush your teeth twice a day and floss at least once per day. When flossing, it is best to floss first then brush. °· Limit sugar between meals and maintain a well-balanced diet.  °· Even the best dental hygiene will not prevent plaque from developing. It is necessary for you to see your dentist on a regular basis for cleaning and regular checkups. °· Your dentist can recommend proper oral hygiene and mouth care and suggest special toothpastes or mouth rinses. °· Stop smoking. °SEEK DENTAL OR MEDICAL CARE IF: °· You have painful, reddened tissue around your teeth, or you have puffy swollen gums. °· You have difficulty chewing. °· You notice any loose or infected teeth. °· You have swollen glands. °· Your gums bleed easily when you brush your teeth or are very tender to the touch. °Document Released: 07/08/2000 Document Revised: 04/06/2011 Document Reviewed: 04/18/2010 °[employer]® Patient Information ©2015 [employer]. This information is not intended to replace advice given to you by your health care provider. Make sure you discuss any questions you have with your health care provider. ° °

## 2014-07-08 NOTE — ED Notes (Signed)
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 Doctor notified and ordered for patient to be off of work for one day.

## 2014-07-08 NOTE — ED Provider Notes (Signed)
CSN: [id number]     Arrival date & time 07/08/14  2005 History   This chart was scribed for Nelva Nay, MD by Doreatha Martin, ED Scribe. This patient was seen in room MH11/MH11 and the patient's care was started at 8:22 PM.     Chief Complaint  Patient presents with  . Headache   The history is provided by the patient. No language interpreter was used.    HPI Comments: Alex Leahy is a 34 y.o. male with no chronic medical history who presents to the Emergency Department complaining of a moderate, gradual onset headache localized to his temples onset yesterday. He states associated dental pain, gum swelling and nose tenderness. He states that his tooth pain began yesterday and is especially bad in the front. Pt reports that his teeth do not bleed when he brushes them. He denies photophobia and fever.   Past Medical History  Diagnosis Date  . Pericardial effusion   . Hypertension   . TB lung, latent   . GERD (gastroesophageal reflux disease)    History reviewed. No pertinent past surgical history. History reviewed. No pertinent family history. History  Substance Use Topics  . Smoking status: Never Smoker   . Smokeless tobacco: Not on file  . Alcohol Use: No    Review of Systems A complete 10 system review of systems was obtained and all systems are negative except as noted in the HPI and PMH.    Allergies  Review of patient's allergies indicates no known allergies.  Home Medications   Prior to Admission medications   Medication Sig Start Date End Date Taking? Authorizing Provider  acetaminophen (TYLENOL) 500 MG tablet Take 1,000 mg by mouth every 6 (six) hours as needed. For pain    Historical Provider, MD  amoxicillin-clavulanate (AUGMENTIN) 875-125 MG per tablet Take 1 tablet by mouth every 12 (twelve) hours. 12/24/12   Elson Areas, PA-C  aspirin 81 MG tablet Take 81 mg by mouth daily.    Historical Provider, MD  cephALEXin (KEFLEX) 500 MG capsule Take 1 capsule (500 mg  total) by mouth 4 (four) times daily. 07/15/13   John Molpus, MD  chlorhexidine (PERIDEX) 0.12 % solution Use as directed 15 mLs in the mouth or throat 2 (two) times daily. 07/08/14   Nelva Nay, MD  cyclobenzaprine (FLEXERIL) 10 MG tablet Take 1 tablet (10 mg total) by mouth 2 (two) times daily as needed for muscle spasms. 01/08/14   Vanetta Mulders, MD  HYDROcodone-acetaminophen (NORCO/VICODIN) 5-325 MG per tablet Take 2 tablets by mouth every 4 (four) hours as needed. 07/08/14   Nelva Nay, MD  ibuprofen (ADVIL,MOTRIN) 200 MG tablet Take 400 mg by mouth every 6 (six) hours as needed. For pain    Historical Provider, MD  naproxen (NAPROSYN) 500 MG tablet Take 1 tablet (500 mg total) by mouth 2 (two) times daily. 01/08/14   Vanetta Mulders, MD  omeprazole (PRILOSEC) 40 MG capsule Take 40 mg by mouth daily.    Historical Provider, MD  penicillin v potassium (VEETID) 500 MG tablet Take 1 tablet (500 mg total) by mouth 4 (four) times daily. 07/08/14 07/15/14  Nelva Nay, MD  sucralfate (CARAFATE) 1 G tablet Take 1 g by mouth 4 (four) times daily.    Historical Provider, MD   BP 152/93 mmHg  Pulse 76  Temp(Src) 98.6 F (37 C) (Oral)  Resp 16  SpO2 98% Physical Exam  Constitutional: He is oriented to person, place, and time. He appears well-developed and  well-nourished. No distress.  HENT:  Head: Normocephalic and atraumatic.  Upper gingival tissue red swollen and painful to palpation.  No evidence of abscess.  Eyes: Pupils are equal, round, and reactive to light.  Neck: Normal range of motion.  Cardiovascular: Normal rate and intact distal pulses.   Pulmonary/Chest: No respiratory distress.  Abdominal: Normal appearance. He exhibits no distension.  Musculoskeletal: Normal range of motion.  Neurological: He is alert and oriented to person, place, and time. No cranial nerve deficit.  Skin: Skin is warm and dry. No rash noted.  Psychiatric: He has a normal mood and affect. His behavior is  normal.  Nursing note and vitals reviewed.   ED Course  Procedures (including critical care time) DIAGNOSTIC STUDIES: Oxygen Saturation is 99% on RA, normal by my interpretation.    COORDINATION OF CARE: 8:25 PM Discussed treatment plan with pt at bedside and pt agreed to plan.   Labs Review Labs Reviewed - No data to display  Imaging Review No results found.   EKG Interpretation None      MDM   Final diagnoses:  Gingivitis       Nelva Nay, MD 07/10/14 [phone]

## 2015-02-04 ENCOUNTER — Emergency Department (HOSPITAL_BASED_OUTPATIENT_CLINIC_OR_DEPARTMENT_OTHER): Payer: 59

## 2015-02-04 ENCOUNTER — Encounter (HOSPITAL_BASED_OUTPATIENT_CLINIC_OR_DEPARTMENT_OTHER): Payer: Self-pay

## 2015-02-04 ENCOUNTER — Emergency Department (HOSPITAL_BASED_OUTPATIENT_CLINIC_OR_DEPARTMENT_OTHER)
Admission: EM | Admit: 2015-02-04 | Discharge: 2015-02-04 | Disposition: A | Discharge status: Home / Self Care | Payer: 59 | Attending: Emergency Medicine | Admitting: Emergency Medicine

## 2015-02-04 DIAGNOSIS — R202 Paresthesia of skin: Secondary | ICD-10-CM | POA: Insufficient documentation

## 2015-02-04 DIAGNOSIS — R200 Anesthesia of skin: Secondary | ICD-10-CM | POA: Diagnosis not present

## 2015-02-04 DIAGNOSIS — M542 Cervicalgia: Secondary | ICD-10-CM | POA: Insufficient documentation

## 2015-02-04 DIAGNOSIS — M549 Dorsalgia, unspecified: Secondary | ICD-10-CM | POA: Insufficient documentation

## 2015-02-04 DIAGNOSIS — I10 Essential (primary) hypertension: Secondary | ICD-10-CM | POA: Diagnosis not present

## 2015-02-04 DIAGNOSIS — Z8611 Personal history of tuberculosis: Secondary | ICD-10-CM | POA: Insufficient documentation

## 2015-02-04 DIAGNOSIS — R0781 Pleurodynia: Secondary | ICD-10-CM | POA: Insufficient documentation

## 2015-02-04 DIAGNOSIS — R079 Chest pain, unspecified: Secondary | ICD-10-CM

## 2015-02-04 DIAGNOSIS — Z8719 Personal history of other diseases of the digestive system: Secondary | ICD-10-CM | POA: Insufficient documentation

## 2015-02-04 LAB — CBC WITH DIFFERENTIAL/PLATELET
BASOS PCT: 0 %
Basophils Absolute: 0 10*3/uL (ref 0.0–0.1)
EOS PCT: 2 %
Eosinophils Absolute: 0.1 10*3/uL (ref 0.0–0.7)
HCT: 50.3 % (ref 39.0–52.0)
Hemoglobin: 16.2 g/dL (ref 13.0–17.0)
LYMPHS ABS: 2.6 10*3/uL (ref 0.7–4.0)
Lymphocytes Relative: 47 %
MCH: 29.3 pg (ref 26.0–34.0)
MCHC: 32.2 g/dL (ref 30.0–36.0)
MCV: 91 fL (ref 78.0–100.0)
MONO ABS: 0.7 10*3/uL (ref 0.1–1.0)
MONOS PCT: 12 %
NEUTROS PCT: 39 %
Neutro Abs: 2.2 10*3/uL (ref 1.7–7.7)
PLATELETS: 291 10*3/uL (ref 150–400)
RBC: 5.53 MIL/uL (ref 4.22–5.81)
RDW: 12.1 % (ref 11.5–15.5)
WBC: 5.6 10*3/uL (ref 4.0–10.5)

## 2015-02-04 LAB — COMPREHENSIVE METABOLIC PANEL
ALK PHOS: 78 U/L (ref 38–126)
ALT: 24 U/L (ref 17–63)
AST: 24 U/L (ref 15–41)
Albumin: 4.2 g/dL (ref 3.5–5.0)
Anion gap: 7 (ref 5–15)
BUN: 12 mg/dL (ref 6–20)
CHLORIDE: 106 mmol/L (ref 101–111)
CO2: 27 mmol/L (ref 22–32)
Calcium: 9.1 mg/dL (ref 8.9–10.3)
Creatinine, Ser: 1.11 mg/dL (ref 0.61–1.24)
GFR calc Af Amer: >60 mL/min (ref >60)
GFR calc non Af Amer: >60 mL/min (ref >60)
Glucose, Bld: 100 mg/dL — ABNORMAL HIGH (ref 65–99)
Potassium: 4.5 mmol/L (ref 3.5–5.1)
SODIUM: 140 mmol/L (ref 135–145)
Total Bilirubin: 0.6 mg/dL (ref 0.3–1.2)
Total Protein: 7.8 g/dL (ref 6.5–8.1)

## 2015-02-04 LAB — TROPONIN I

## 2015-02-04 MED ORDER — IOHEXOL 300 MG/ML  SOLN
75.0000 mL | Freq: Once | INTRAMUSCULAR | Status: AC | PRN
  Administered 2015-02-04: 75 mL via INTRAVENOUS

## 2015-02-04 NOTE — ED Notes (Signed)
MD at bedside. 

## 2015-02-04 NOTE — ED Notes (Signed)
CP since yesterday

## 2015-02-04 NOTE — ED Notes (Signed)
Patient transported to X-ray 

## 2015-02-04 NOTE — ED Notes (Signed)
Family at bedside. 

## 2015-02-04 NOTE — Discharge Instructions (Signed)
Ibuprofen 600 mg every 6 hours as needed for pain.  Follow-up with your primary Dr. if not improving in the next several days, and return to the ER if your symptoms significantly worsen or change.   Nonspecific Chest Pain  Chest pain can be caused by many different conditions. There is always a chance that your pain could be related to something serious, such as a heart attack or a blood clot in your lungs. Chest pain can also be caused by conditions that are not life-threatening. If you have chest pain, it is very important to follow up with your health care provider. CAUSES  Chest pain can be caused by:  Heartburn.  Pneumonia or bronchitis.  Anxiety or stress.  Inflammation around your heart (pericarditis) or lung (pleuritis or pleurisy).  A blood clot in your lung.  A collapsed lung (pneumothorax). It can develop suddenly on its own (spontaneous pneumothorax) or from trauma to the chest.  Shingles infection (varicella-zoster virus).  Heart attack.  Damage to the bones, muscles, and cartilage that make up your chest wall. This can include:  Bruised bones due to injury.  Strained muscles or cartilage due to frequent or repeated coughing or overwork.  Fracture to one or more ribs.  Sore cartilage due to inflammation (costochondritis). RISK FACTORS  Risk factors for chest pain may include:  Activities that increase your risk for trauma or injury to your chest.  Respiratory infections or conditions that cause frequent coughing.  Medical conditions or overeating that can cause heartburn.  Heart disease or family history of heart disease.  Conditions or health behaviors that increase your risk of developing a blood clot.  Having had chicken pox (varicella zoster). SIGNS AND SYMPTOMS Chest pain can feel like:  Burning or tingling on the surface of your chest or deep in your chest.  Crushing, pressure, aching, or squeezing pain.  Dull or sharp pain that is worse when  you move, cough, or take a deep breath.  Pain that is also felt in your back, neck, shoulder, or arm, or pain that spreads to any of these areas. Your chest pain may come and go, or it may stay constant. DIAGNOSIS Lab tests or other studies may be needed to find the cause of your pain. Your health care provider may have you take a test called an ambulatory ECG (electrocardiogram). An ECG records your heartbeat patterns at the time the test is performed. You may also have other tests, such as:  Transthoracic echocardiogram (TTE). During echocardiography, sound waves are used to create a picture of all of the heart structures and to look at how blood flows through your heart.  Transesophageal echocardiogram (TEE).This is a more advanced imaging test that obtains images from inside your body. It allows your health care provider to see your heart in finer detail.  Cardiac monitoring. This allows your health care provider to monitor your heart rate and rhythm in real time.  Holter monitor. This is a portable device that records your heartbeat and can help to diagnose abnormal heartbeats. It allows your health care provider to track your heart activity for several days, if needed.  Stress tests. These can be done through exercise or by taking medicine that makes your heart beat more quickly.  Blood tests.  Imaging tests. TREATMENT  Your treatment depends on what is causing your chest pain. Treatment may include:  Medicines. These may include:  Acid blockers for heartburn.  Anti-inflammatory medicine.  Pain medicine for inflammatory conditions.  Antibiotic medicine, if an infection is present.  Medicines to dissolve blood clots.  Medicines to treat coronary artery disease.  Supportive care for conditions that do not require medicines. This may include:  Resting.  Applying heat or cold packs to injured areas.  Limiting activities until pain decreases. HOME CARE INSTRUCTIONS  If  you were prescribed an antibiotic medicine, finish it all even if you start to feel better.  Avoid any activities that bring on chest pain.  Do not use any tobacco products, including cigarettes, chewing tobacco, or electronic cigarettes. If you need help quitting, ask your health care provider.  Do not drink alcohol.  Take medicines only as directed by your health care provider.  Keep all follow-up visits as directed by your health care provider. This is important. This includes any further testing if your chest pain does not go away.  If heartburn is the cause for your chest pain, you may be told to keep your head raised (elevated) while sleeping. This reduces the chance that acid will go from your stomach into your esophagus.  Make lifestyle changes as directed by your health care provider. These may include:  Getting regular exercise. Ask your health care provider to suggest some activities that are safe for you.  Eating a heart-healthy diet. A registered dietitian can help you to learn healthy eating options.  Maintaining a healthy weight.  Managing diabetes, if necessary.  Reducing stress. SEEK MEDICAL CARE IF:  Your chest pain does not go away after treatment.  You have a rash with blisters on your chest.  You have a fever. SEEK IMMEDIATE MEDICAL CARE IF:   Your chest pain is worse.  You have an increasing cough, or you cough up blood.  You have severe abdominal pain.  You have severe weakness.  You faint.  You have chills.  You have sudden, unexplained chest discomfort.  You have sudden, unexplained discomfort in your arms, back, neck, or jaw.  You have shortness of breath at any time.  You suddenly start to sweat, or your skin gets clammy.  You feel nauseous or you vomit.  You suddenly feel light-headed or dizzy.  Your heart begins to beat quickly, or it feels like it is skipping beats. These symptoms may represent a serious problem that is an  emergency. Do not wait to see if the symptoms will go away. Get medical help right away. Call your local emergency services (911 in the [place] Do not drive yourself to the hospital.   This information is not intended to replace advice given to you by your health care provider. Make sure you discuss any questions you have with your health care provider.   Document Released: 10/22/2004 Document Revised: 02/02/2014 Document Reviewed: 08/18/2013 Elsevier Interactive Patient Education [employer] Else[employer] Inc[employer]

## 2015-02-04 NOTE — ED Provider Notes (Signed)
CSN: [id number]     Arrival date & time 02/04/15  1818 History  By signing my name below, I, [name], attest that this documentation has been prepared under the direction and in the presence of Geoffery Lyons, MD. Electronically Signed: [name], ED Scribe. 02/04/2015. 6:50 PM.    Chief Complaint  Patient presents with  . Chest Pain   Patient is a 35 y.o. male presenting with chest pain. The history is provided by the patient. No language interpreter was used.  Chest Pain Pain location:  L chest Pain quality: aching   Pain radiates to:  Mid back, lower back, L shoulder and L arm Pain radiates to the back: yes   Pain severity:  Moderate Onset quality:  Gradual Duration:  2 days Timing:  Intermittent Progression:  Worsening Chronicity:  New Context: lifting   Worsened by:  Exertion and deep breathing Associated symptoms: back pain and numbness ("tingling")   Associated symptoms: no lower extremity edema   Risk factors: hypertension and male sex    HPI Comments: Trevor Lewis is a 35 y.o. male with a PMHx of pericardial effusion (3 years ago), HTN, TB, and GERD who presents to the Emergency Department complaining of intermittent, aching, worsening chest pain onset 2 days ago, worsening as of today. He reports associated dyspnea on exertion, left arm pain, tingling in there left wrist, and burning left shoulder, mid, and lower back pain, as well as pain in his neck. He notes exacerbation of the pain with palpation of the chest and deep breathing. He notes he does heavy lifting at work and when working out. He states that when he had pericardial effusion 3 years ago, he was seen at [facility], where fluid had to be drawn off, and he was placed on medication for this. He notes he is not currently on any medications. He denies a PMHx of DVT or PE, as well as a FHx of heart disease. Pt denies leg swelling.   Past Medical History  Diagnosis Date  . Pericardial effusion   .  Hypertension   . TB lung, latent   . GERD (gastroesophageal reflux disease)    History reviewed. No pertinent past surgical history. No family history on file. Social History  Substance Use Topics  . Smoking status: Never Smoker   . Smokeless tobacco: None  . Alcohol Use: No    Review of Systems  Cardiovascular: Positive for chest pain. Negative for leg swelling.  Musculoskeletal: Positive for myalgias, back pain, arthralgias and neck pain.  Neurological: Positive for numbness ("tingling").  All other systems reviewed and are negative.   Allergies  Review of patient's allergies indicates no known allergies.  Home Medications   Prior to Admission medications   Not on File   BP 161/105 mmHg  Pulse 75  Temp(Src) 98.3 F (36.8 C) (Oral)  Resp 18  Ht 5\' 11"  (1.803 m)  Wt 210 lb (95.255 kg)  BMI 29.30 kg/m2  SpO2 98% Physical Exam  Constitutional: He is oriented to person, place, and time. He appears well-developed and well-nourished.  HENT:  Head: Normocephalic and atraumatic.  Eyes: Conjunctivae are normal. Right eye exhibits no discharge. Left eye exhibits no discharge.  Cardiovascular: Normal rate, regular rhythm and normal heart sounds.  Exam reveals no gallop and no friction rub.   No murmur heard. Pulmonary/Chest: Effort normal and breath sounds normal. No respiratory distress. He has no wheezes. He has no rales. He exhibits tenderness.  Mild TTP to  the left lower anterior ribs.  Neurological: He is alert and oriented to person, place, and time. Coordination normal.  Skin: Skin is warm and dry. No rash noted. He is not diaphoretic. No erythema.  Psychiatric: He has a normal mood and affect.  Nursing note and vitals reviewed.   ED Course  Procedures  DIAGNOSTIC STUDIES: Oxygen Saturation is 98% on RA, normal by my interpretation.    COORDINATION OF CARE: 6:39 PM - Discussed normal EKG results. Discussed plans to order diagnostic studies and imaging. Pt  advised of plan for treatment and pt agrees.  Labs Review Labs Reviewed  COMPREHENSIVE METABOLIC PANEL - Abnormal; Notable for the following:    Glucose, Bld 100 (*)    All other components within normal limits  CBC WITH DIFFERENTIAL/PLATELET  TROPONIN I    Imaging Review Dg Chest 2 View  02/04/2015  CLINICAL DATA:  Left-sided chest pain with cough and shortness of breath EXAM: CHEST  2 VIEW COMPARISON:  January 08, 2014 FINDINGS: Lungs are clear. Heart size and pulmonary vascularity are normal. No adenopathy. No pneumothorax. No bone lesions. IMPRESSION: No edema or consolidation. Electronically Signed   By: Bretta BangWilliam  Woodruff III M.D.   On: 02/04/2015 19:02   Ct Chest W Contrast  02/04/2015  CLINICAL DATA:  Acute left-sided chest pain. EXAM: CT CHEST WITH CONTRAST TECHNIQUE: Multidetector CT imaging of the chest was performed during intravenous contrast administration. CONTRAST:  75mL OMNIPAQUE IOHEXOL 300 MG/ML  SOLN COMPARISON:  Chest radiograph of same day. FINDINGS: No pneumothorax or pleural effusion is noted. No acute pulmonary disease is noted. There is no evidence of thoracic aortic dissection or aneurysm. No mediastinal mass or adenopathy is noted. Visualized portion of upper abdomen is unremarkable. No significant osseous abnormality is noted. IMPRESSION: No abnormality seen in the chest. Electronically Signed   By: Lupita RaiderJames  Green Jr, M.D.   On: 02/04/2015 21:47   I have personally reviewed and evaluated these images and lab results as part of my medical decision-making.   EKG Interpretation   Date/Time:  Monday February 04 2015 18:26:19 EST Ventricular Rate:  78 PR Interval:  146 QRS Duration: 80 QT Interval:  364 QTC Calculation: 414 R Axis:   -47 Text Interpretation:  Normal sinus rhythm Possible Left atrial enlargement  Left axis deviation Abnormal ECG Confirmed by Amanie Mcculley  MD, Adaysha Dubinsky ([id number]) on  02/04/2015 6:31:11 PM      MDM   Final diagnoses:  None    Patient  presents with complaints of chest discomfort that has been ongoing for the past 24 hours. He has a history of a pericardial effusion approximately 3 years ago that was treated [facility]. He had some sort of pericardiocentesis that revealed a likely viral infection. He is concerned that maybe this has recurred. There is nothing on his chest x-ray to indicate this and a CT scan was performed to further evaluate. This revealed no evidence for pericardial effusion or other intrathoracic abnormality. His symptoms are extremely atypical for cardiac pain and he has an unchanged EKG and negative troponin. I do not feel as though any further workup is indicated and believe he is appropriate for discharge.  His blood pressure was somewhat elevated while in the emergency department, however nothing that I believe requires intervention. His significant other at bedside question me and this and I reassured her that the readings we are getting here are acceptable to be followed at home and treated in the doctor's office if they persist.  I personally performed the services described in this documentation, which was scribed in my presence. The recorded information has been reviewed and is accurate.       Geoffery Lyons, MD 02/04/15 2203

## 2015-06-26 ENCOUNTER — Emergency Department (HOSPITAL_BASED_OUTPATIENT_CLINIC_OR_DEPARTMENT_OTHER)
Admission: EM | Admit: 2015-06-26 | Discharge: 2015-06-27 | Disposition: A | Discharge status: Home / Self Care | Payer: 59 | Attending: Emergency Medicine | Admitting: Emergency Medicine

## 2015-06-26 ENCOUNTER — Encounter (HOSPITAL_BASED_OUTPATIENT_CLINIC_OR_DEPARTMENT_OTHER): Payer: Self-pay | Admitting: *Deleted

## 2015-06-26 DIAGNOSIS — M5442 Lumbago with sciatica, left side: Secondary | ICD-10-CM | POA: Insufficient documentation

## 2015-06-26 DIAGNOSIS — I10 Essential (primary) hypertension: Secondary | ICD-10-CM | POA: Diagnosis not present

## 2015-06-26 DIAGNOSIS — M545 Low back pain: Secondary | ICD-10-CM | POA: Diagnosis present

## 2015-06-26 MED ORDER — NAPROXEN 500 MG PO TABS
500.0000 mg | ORAL_TABLET | Freq: Two times a day (BID) | ORAL | Status: DC
Start: 2015-06-26 — End: 2015-09-14

## 2015-06-26 MED ORDER — TRAMADOL HCL 50 MG PO TABS: 50.0000 mg | ORAL_TABLET | Freq: Four times a day (QID) | ORAL | Status: DC | PRN

## 2015-06-26 MED ORDER — METHOCARBAMOL 500 MG PO TABS: 500.0000 mg | ORAL_TABLET | Freq: Two times a day (BID) | ORAL | Status: DC

## 2015-06-26 NOTE — ED Provider Notes (Signed)
CSN: [id number]     Arrival date & time 06/26/15  2245 History   First MD Initiated Contact with Patient 06/26/15 2305     Chief Complaint  Patient presents with  . Back Pain     (Consider location/radiation/quality/duration/timing/severity/associated sxs/prior Treatment) HPI Comments: Patient presents today with lower back pain that has been intermittent over the past 2-3 weeks.  He reports occasional radiation of the pain to the left leg.  He denies any acute injury or trauma.  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  He denies any numbness, tingling, fever, chills, urinary symptoms, bowel/bladder incontinence, weakness, or any other symptoms at this time.  He has taken Ibuprofen for the pain without improvement.  No history of IVDU or Cancer.  No history of spinal injections or surgery.    The history is provided by the patient.    Past Medical History  Diagnosis Date  . Pericardial effusion   . Hypertension   . TB lung, latent   . GERD (gastroesophageal reflux disease)    History reviewed. No pertinent past surgical history. History reviewed. No pertinent family history. Social History  Substance Use Topics  . Smoking status: Never Smoker   . Smokeless tobacco: None  . Alcohol Use: No    Review of Systems  All other systems reviewed and are negative.     Allergies  Review of patient's allergies indicates no known allergies.  Home Medications   Prior to Admission medications   Not on File   BP 138/72 mmHg  Pulse 75  Temp(Src) 98.8 F (37.1 C)  Resp 18  Ht 5\' 11"  (1.803 m)  Wt 93.895 kg  BMI 28.88 kg/m2  SpO2 98% Physical Exam  Constitutional: He appears well-developed and well-nourished.  HENT:  Head: Normocephalic and atraumatic.  Neck: Normal range of motion. Neck supple.  Cardiovascular: Normal rate, regular rhythm and normal heart sounds.   Pulmonary/Chest: Effort normal and breath sounds normal.  Musculoskeletal: Normal range of motion.      Lumbar back: He exhibits normal range of motion, no swelling, no edema and no deformity.  Tenderness to palpation and tightness of the muscles across lower back.  No spinal tenderness.   Neurological: He is alert. He has normal strength. No sensory deficit. Gait normal.  Reflex Scores:      Patellar reflexes are 2+ on the right side and 2+ on the left side. Distal sensation of both feet intact Muscle strength 5/5 of LE bilaterally  Skin: Skin is warm and dry.  Psychiatric: He has a normal mood and affect.  Nursing note and vitals reviewed.   ED Course  Procedures (including critical care time) Labs Review Labs Reviewed - No data to display  Imaging Review No results found. I have personally reviewed and evaluated these images and lab results as part of my medical decision-making.   EKG Interpretation None      MDM   Final diagnoses:  None   Patient with back pain.  No neurological deficits and normal neuro exam.  Patient can walk but states is painful.  No loss of bowel or bladder control.  No concern for cauda equina.  No fever, night sweats, weight loss, h/o cancer, IVDU.  Patient stable for discharge.  Return precautions given.        Santiago GladHeather Poppy Mcafee, PA-C 06/27/15 2230  Cathren LaineKevin Steinl, MD 06/28/15 [phone]

## 2015-06-26 NOTE — ED Notes (Signed)
PT c/o lumbar pain that radiates down L leg. Pt describes it as a burning with an onset 2 weeks ago which waxes and wanes in severity. Pain suddenly became worse today. 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

## 2015-06-26 NOTE — ED Notes (Signed)
Pt c/o lower back pain which radiates downn left leg x 3 weeks

## 2015-06-27 NOTE — ED Notes (Signed)
Pt verbalizes understanding of d/c instructions and denies any further needs at this time. 

## 2015-08-17 ENCOUNTER — Emergency Department (HOSPITAL_BASED_OUTPATIENT_CLINIC_OR_DEPARTMENT_OTHER): Payer: 59

## 2015-08-17 ENCOUNTER — Other Ambulatory Visit: Payer: Self-pay

## 2015-08-17 ENCOUNTER — Emergency Department (HOSPITAL_BASED_OUTPATIENT_CLINIC_OR_DEPARTMENT_OTHER)
Admission: EM | Admit: 2015-08-17 | Discharge: 2015-08-17 | Disposition: A | Discharge status: Home / Self Care | Payer: 59 | Attending: Emergency Medicine | Admitting: Emergency Medicine

## 2015-08-17 ENCOUNTER — Encounter (HOSPITAL_BASED_OUTPATIENT_CLINIC_OR_DEPARTMENT_OTHER): Payer: Self-pay | Admitting: Emergency Medicine

## 2015-08-17 DIAGNOSIS — M79605 Pain in left leg: Secondary | ICD-10-CM | POA: Diagnosis not present

## 2015-08-17 DIAGNOSIS — R0602 Shortness of breath: Secondary | ICD-10-CM | POA: Diagnosis not present

## 2015-08-17 DIAGNOSIS — I10 Essential (primary) hypertension: Secondary | ICD-10-CM | POA: Diagnosis not present

## 2015-08-17 DIAGNOSIS — R0789 Other chest pain: Secondary | ICD-10-CM | POA: Diagnosis not present

## 2015-08-17 DIAGNOSIS — R079 Chest pain, unspecified: Secondary | ICD-10-CM

## 2015-08-17 LAB — BASIC METABOLIC PANEL
Anion gap: 5 (ref 5–15)
BUN: 11 mg/dL (ref 6–20)
CALCIUM: 8.8 mg/dL — AB (ref 8.9–10.3)
CO2: 31 mmol/L (ref 22–32)
Chloride: 100 mmol/L — ABNORMAL LOW (ref 101–111)
Creatinine, Ser: 1.17 mg/dL (ref 0.61–1.24)
GFR calc Af Amer: >60 mL/min (ref >60)
Glucose, Bld: 103 mg/dL — ABNORMAL HIGH (ref 65–99)
Potassium: 3.2 mmol/L — ABNORMAL LOW (ref 3.5–5.1)
SODIUM: 136 mmol/L (ref 135–145)

## 2015-08-17 LAB — CBC WITH DIFFERENTIAL/PLATELET
Basophils Absolute: 0 10*3/uL (ref 0.0–0.1)
Basophils Relative: 0 %
EOS ABS: 0.1 10*3/uL (ref 0.0–0.7)
EOS PCT: 2 %
HCT: 45.3 % (ref 39.0–52.0)
Hemoglobin: 15.3 g/dL (ref 13.0–17.0)
Lymphocytes Relative: 44 %
Lymphs Abs: 2.6 10*3/uL (ref 0.7–4.0)
MCH: 30.4 pg (ref 26.0–34.0)
MCHC: 33.8 g/dL (ref 30.0–36.0)
MCV: 89.9 fL (ref 78.0–100.0)
Monocytes Absolute: 0.7 10*3/uL (ref 0.1–1.0)
Monocytes Relative: 12 %
Neutro Abs: 2.4 10*3/uL (ref 1.7–7.7)
Neutrophils Relative %: 42 %
PLATELETS: 319 10*3/uL (ref 150–400)
RBC: 5.04 MIL/uL (ref 4.22–5.81)
RDW: 12 % (ref 11.5–15.5)
WBC: 5.7 10*3/uL (ref 4.0–10.5)

## 2015-08-17 LAB — TROPONIN I

## 2015-08-17 NOTE — ED Notes (Signed)
MD at bedside. 

## 2015-08-17 NOTE — Discharge Instructions (Signed)
Trevor Lewis follow-up with cardiology to discuss a stress test. The contact information for the [facility] cardiology office has been provided in this discharge summary for you to make these arrangements.  Return to the emergency department if your symptoms significantly worsen or change.   Nonspecific Chest Pain  Chest pain can be caused by many different conditions. There is always a chance that your pain could be related to something serious, such as a heart attack or a blood clot in your lungs. Chest pain can also be caused by conditions that are not life-threatening. If you have chest pain, it is very important to follow up with your health care provider. CAUSES  Chest pain can be caused by:  Heartburn.  Pneumonia or bronchitis.  Anxiety or stress.  Inflammation around your heart (pericarditis) or lung (pleuritis or pleurisy).  A blood clot in your lung.  A collapsed lung (pneumothorax). It can develop suddenly on its own (spontaneous pneumothorax) or from trauma to the chest.  Shingles infection (varicella-zoster virus).  Heart attack.  Damage to the bones, muscles, and cartilage that make up your chest wall. This can include:  Bruised bones due to injury.  Strained muscles or cartilage due to frequent or repeated coughing or overwork.  Fracture to one or more ribs.  Sore cartilage due to inflammation (costochondritis). RISK FACTORS  Risk factors for chest pain may include:  Activities that increase your risk for trauma or injury to your chest.  Respiratory infections or conditions that cause frequent coughing.  Medical conditions or overeating that can cause heartburn.  Heart disease or family history of heart disease.  Conditions or health behaviors that increase your risk of developing a blood clot.  Having had chicken pox (varicella zoster). SIGNS AND SYMPTOMS Chest pain can feel like:  Burning or tingling on the surface of your chest or deep in your  chest.  Crushing, pressure, aching, or squeezing pain.  Dull or sharp pain that is worse when you move, cough, or take a deep breath.  Pain that is also felt in your back, neck, shoulder, or arm, or pain that spreads to any of these areas. Your chest pain may come and go, or it may stay constant. DIAGNOSIS Lab tests or other studies may be needed to find the cause of your pain. Your health care provider may have you take a test called an ambulatory ECG (electrocardiogram). An ECG records your heartbeat patterns at the time the test is performed. You may also have other tests, such as:  Transthoracic echocardiogram (TTE). During echocardiography, sound waves are used to create a picture of all of the heart structures and to look at how blood flows through your heart.  Transesophageal echocardiogram (TEE).This is a more advanced imaging test that obtains images from inside your body. It allows your health care provider to see your heart in finer detail.  Cardiac monitoring. This allows your health care provider to monitor your heart rate and rhythm in real time.  Holter monitor. This is a portable device that records your heartbeat and can help to diagnose abnormal heartbeats. It allows your health care provider to track your heart activity for several days, if needed.  Stress tests. These can be done through exercise or by taking medicine that makes your heart beat more quickly.  Blood tests.  Imaging tests. TREATMENT  Your treatment depends on what is causing your chest pain. Treatment may include:  Medicines. These may include:  Acid blockers for heartburn.  Anti-inflammatory  medicine.  Pain medicine for inflammatory conditions.  Antibiotic medicine, if an infection is present.  Medicines to dissolve blood clots.  Medicines to treat coronary artery disease.  Supportive care for conditions that do not require medicines. This may include:  Resting.  Applying heat or cold  packs to injured areas.  Limiting activities until pain decreases. HOME CARE INSTRUCTIONS  If you were prescribed an antibiotic medicine, finish it all even if you start to feel better.  Avoid any activities that bring on chest pain.  Do not use any tobacco products, including cigarettes, chewing tobacco, or electronic cigarettes. If you need help quitting, ask your health care provider.  Do not drink alcohol.  Take medicines only as directed by your health care provider.  Keep all follow-up visits as directed by your health care provider. This is important. This includes any further testing if your chest pain does not go away.  If heartburn is the cause for your chest pain, you may be told to keep your head raised (elevated) while sleeping. This reduces the chance that acid will go from your stomach into your esophagus.  Make lifestyle changes as directed by your health care provider. These may include:  Getting regular exercise. Ask your health care provider to suggest some activities that are safe for you.  Eating a heart-healthy diet. A registered dietitian can help you to learn healthy eating options.  Maintaining a healthy weight.  Managing diabetes, if necessary.  Reducing stress. SEEK MEDICAL CARE IF:  Your chest pain does not go away after treatment.  You have a rash with blisters on your chest.  You have a fever. SEEK IMMEDIATE MEDICAL CARE IF:   Your chest pain is worse.  You have an increasing cough, or you cough up blood.  You have severe abdominal pain.  You have severe weakness.  You faint.  You have chills.  You have sudden, unexplained chest discomfort.  You have sudden, unexplained discomfort in your arms, back, neck, or jaw.  You have shortness of breath at any time.  You suddenly start to sweat, or your skin gets clammy.  You feel nauseous or you vomit.  You suddenly feel light-headed or dizzy.  Your heart begins to beat quickly, or  it feels like it is skipping beats. These symptoms may represent a serious problem that is an emergency. Do not wait to see if the symptoms will go away. Get medical help right away. Call your local emergency services (911 in the [place] Do not drive yourself to the hospital.   This information is not intended to replace advice given to you by your health care provider. Make sure you discuss any questions you have with your health care provider.   Document Released: 10/22/2004 Document Revised: 02/02/2014 Document Reviewed: 08/18/2013 Elsevier Interactive Patient Education [employer].

## 2015-08-17 NOTE — ED Provider Notes (Signed)
CSN: [id number]     Arrival date & time 08/17/15  2132 History   By signing my name below, I, Terrance Branch, attest that this documentation has been prepared under the direction and in the presence of Geoffery Lyons, MD. Electronically Signed: Evon Slack, ED Scribe. 08/17/2015. 11:02 PM.     Chief Complaint  Patient presents with  . Chest Pain   Patient is a 35 y.o. male presenting with chest pain. The history is provided by the patient. No language interpreter was used.  Chest Pain Associated symptoms: shortness of breath   Associated symptoms: no nausea    HPI Comments: Trevor Lewis is a 35 y.o. male who presents to the Emergency Department complaining of intermittent CP onset 1 day prior. radditing pain to his left shoulder. Pt states that he has associated  intermittent SOB. Pt also reports that he has some pain in his left leg. Pt states that he has tried ibuprofen and aspirin this morning. Pt reports that he feels that his BP has been high the last 2 days. He states that his highest BP reading was 170/110. Pt reports that he has had intermittent tingling in his fingers. Pt denies being on any medications for his BP. Denies nausea or other related symptoms. Hx of pericardial effusion 3 years prior but this pain is not the same.    Past Medical History  Diagnosis Date  . Pericardial effusion   . Hypertension   . TB lung, latent   . GERD (gastroesophageal reflux disease)    History reviewed. No pertinent past surgical history. History reviewed. No pertinent family history. Social History  Substance Use Topics  . Smoking status: Never Smoker   . Smokeless tobacco: None  . Alcohol Use: No    Review of Systems  Respiratory: Positive for shortness of breath.   Cardiovascular: Positive for chest pain.  Gastrointestinal: Negative for nausea.  All other systems reviewed and are negative.     Allergies  Review of patient's allergies indicates no known allergies.  Home  Medications   Prior to Admission medications   Medication Sig Start Date End Date Taking? Authorizing Provider  methocarbamol (ROBAXIN) 500 MG tablet Take 1 tablet (500 mg total) by mouth 2 (two) times daily. 06/26/15   Heather Laisure, PA-C  naproxen (NAPROSYN) 500 MG tablet Take 1 tablet (500 mg total) by mouth 2 (two) times daily. 06/26/15   Heather Laisure, PA-C  traMADol (ULTRAM) 50 MG tablet Take 1 tablet (50 mg total) by mouth every 6 (six) hours as needed. 06/26/15   Heather Laisure, PA-C   BP 153/96 mmHg  Pulse 70  Temp(Src) 98.6 F (37 C) (Oral)  Resp 18  Ht  (1.803 m)  Wt 219 lb (99.338 kg)  BMI 30.56 kg/m2  SpO2 100%    Physical Exam  Constitutional: He is oriented to person, place, and time. He appears well-developed and well-nourished. No distress.  HENT:  Head: Normocephalic and atraumatic.  Eyes: Conjunctivae and EOM are normal.  Neck: Neck supple. No tracheal deviation present.  Cardiovascular: Normal rate, regular rhythm and normal heart sounds.   No murmur heard. Pulmonary/Chest: Effort normal and breath sounds normal. No respiratory distress. He has no wheezes. He has no rales.  Abdominal: Soft. There is no tenderness.  Musculoskeletal: Normal range of motion.  Neurological: He is alert and oriented to person, place, and time.  Skin: Skin is warm and dry.  Psychiatric: He has a normal mood and affect. His behavior is  normal.  Nursing note and vitals reviewed.   ED Course  Procedures (including critical care time) DIAGNOSTIC STUDIES: Oxygen Saturation is 100% on RA, normal by my interpretation.    COORDINATION OF CARE: 11:03 PM-Discussed treatment plan which includes CXR, CBC panel, BMP with pt at bedside and pt agreed to plan.     Labs Review Labs Reviewed  BASIC METABOLIC PANEL - Abnormal; Notable for the following:    Potassium 3.2 (*)    Chloride 100 (*)    Glucose, Bld 103 (*)    Calcium 8.8 (*)    All other components within normal  limits  CBC WITH DIFFERENTIAL/PLATELET  TROPONIN I    Imaging Review No results found. I have personally reviewed and evaluated these lab results as part of my medical decision-making.   EKG Interpretation   Date/Time:  Saturday August 17 2015 21:53:57 EDT Ventricular Rate:  73 PR Interval:  138 QRS Duration: 88 QT Interval:  374 QTC Calculation: 412 R Axis:   -58 Text Interpretation:  Normal sinus rhythm Possible Left atrial enlargement  Left axis deviation Nonspecific T wave abnormality Abnormal ECG No  significant change since last tracing Confirmed by NGUYEN, EMILY ([id number])  on 08/17/2015 10:44:52 PM      MDM   Final diagnoses:  None      Patient with history of previous pericardial effusion related to some sort of viral infection. This was treated at [facility] approximately 3 years ago. He presents today with complaints of chest discomfort, elevated blood pressure that has been ongoing since yesterday. He reports a sharp pain to his left upper chest along with numbness to his left arm and left leg that has been occurring intermittently. His physical examination is unremarkable, EKG is unchanged, and troponin is negative. I see no reason for hospitalization, however will give the patient follow-up information with cardiology to discuss a stress test. He has no other cardiac risk factors and his symptoms are atypical for cardiac pain. He is also normotensive in the emergency department with his most recent blood pressure being 124/84. It was apparently nearly 170 at home prior to coming here.   I personally performed the services described in this documentation, which was scribed in my presence. The recorded information has been reviewed and is accurate.       Geoffery Lyons, MD 08/17/15 2322

## 2015-08-17 NOTE — ED Notes (Signed)
Pt. returned from XR. 

## 2015-08-17 NOTE — ED Notes (Signed)
Patient states that he is having pain to his left chest and arm, this prompted him to take his blood pressure and it read high at home

## 2015-09-14 ENCOUNTER — Emergency Department (HOSPITAL_BASED_OUTPATIENT_CLINIC_OR_DEPARTMENT_OTHER): Payer: 59

## 2015-09-14 ENCOUNTER — Emergency Department (HOSPITAL_BASED_OUTPATIENT_CLINIC_OR_DEPARTMENT_OTHER)
Admission: EM | Admit: 2015-09-14 | Discharge: 2015-09-14 | Disposition: A | Discharge status: Home / Self Care | Payer: 59 | Attending: Emergency Medicine | Admitting: Emergency Medicine

## 2015-09-14 ENCOUNTER — Encounter (HOSPITAL_BASED_OUTPATIENT_CLINIC_OR_DEPARTMENT_OTHER): Payer: Self-pay | Admitting: Emergency Medicine

## 2015-09-14 DIAGNOSIS — Y92512 Supermarket, store or market as the place of occurrence of the external cause: Secondary | ICD-10-CM | POA: Diagnosis not present

## 2015-09-14 DIAGNOSIS — I10 Essential (primary) hypertension: Secondary | ICD-10-CM | POA: Diagnosis not present

## 2015-09-14 DIAGNOSIS — Y999 Unspecified external cause status: Secondary | ICD-10-CM | POA: Insufficient documentation

## 2015-09-14 DIAGNOSIS — S0093XA Contusion of unspecified part of head, initial encounter: Secondary | ICD-10-CM

## 2015-09-14 DIAGNOSIS — S0083XA Contusion of other part of head, initial encounter: Secondary | ICD-10-CM | POA: Diagnosis not present

## 2015-09-14 DIAGNOSIS — R55 Syncope and collapse: Secondary | ICD-10-CM | POA: Diagnosis present

## 2015-09-14 DIAGNOSIS — Y9389 Activity, other specified: Secondary | ICD-10-CM | POA: Insufficient documentation

## 2015-09-14 MED ORDER — METHOCARBAMOL 500 MG PO TABS: 500.0000 mg | ORAL_TABLET | Freq: Two times a day (BID) | ORAL | 0 refills | Status: DC

## 2015-09-14 MED ORDER — HYDROCODONE-ACETAMINOPHEN 5-325 MG PO TABS
1.0000 | ORAL_TABLET | Freq: Once | ORAL | Status: AC
  Administered 2015-09-14: 1 via ORAL
  Filled 2015-09-14: qty 1

## 2015-09-14 MED ORDER — NAPROXEN 500 MG PO TABS
500.0000 mg | ORAL_TABLET | Freq: Two times a day (BID) | ORAL | 0 refills | Status: DC
Start: 2015-09-14 — End: 2015-10-18

## 2015-09-14 NOTE — Discharge Instructions (Signed)
Do not take the muscle relaxant if driving as it will make you sleepy. Return as needed for worsening symptoms °
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
## 2015-10-18 ENCOUNTER — Encounter: Payer: Self-pay | Admitting: Family Medicine

## 2015-10-18 ENCOUNTER — Ambulatory Visit (INDEPENDENT_AMBULATORY_CARE_PROVIDER_SITE_OTHER): Payer: 59 | Admitting: Family Medicine

## 2015-10-18 VITALS — BP 116/70 | HR 73 | Temp 87.2°F | Ht 71.0 in | Wt 219.0 lb

## 2015-10-18 DIAGNOSIS — M545 Low back pain, unspecified: Secondary | ICD-10-CM

## 2015-10-18 DIAGNOSIS — M546 Pain in thoracic spine: Secondary | ICD-10-CM | POA: Diagnosis not present

## 2015-10-18 DIAGNOSIS — E669 Obesity, unspecified: Secondary | ICD-10-CM | POA: Diagnosis not present

## 2015-10-18 DIAGNOSIS — Z114 Encounter for screening for human immunodeficiency virus [HIV]: Secondary | ICD-10-CM | POA: Diagnosis not present

## 2015-10-18 DIAGNOSIS — Z131 Encounter for screening for diabetes mellitus: Secondary | ICD-10-CM

## 2015-10-18 DIAGNOSIS — Z13220 Encounter for screening for lipoid disorders: Secondary | ICD-10-CM

## 2015-10-18 LAB — COMPREHENSIVE METABOLIC PANEL
ALK PHOS: 62 U/L (ref 40–115)
ALT: 15 U/L (ref 9–46)
AST: 17 U/L (ref 10–40)
Albumin: 3.9 g/dL (ref 3.6–5.1)
BILIRUBIN TOTAL: 0.4 mg/dL (ref 0.2–1.2)
BUN: 12 mg/dL (ref 7–25)
CO2: 26 mmol/L (ref 20–31)
CREATININE: 1.38 mg/dL — AB (ref 0.60–1.35)
Calcium: 9.4 mg/dL (ref 8.6–10.3)
Chloride: 105 mmol/L (ref 98–110)
GLUCOSE: 93 mg/dL (ref 65–99)
Potassium: 4.2 mmol/L (ref 3.5–5.3)
SODIUM: 140 mmol/L (ref 135–146)
TOTAL PROTEIN: 6.9 g/dL (ref 6.1–8.1)

## 2015-10-18 LAB — LIPID PANEL
CHOL/HDL RATIO: 6.5 ratio — AB (ref <=5.0)
Cholesterol: 209 mg/dL — ABNORMAL HIGH (ref 125–200)
HDL: 32 mg/dL — ABNORMAL LOW (ref >=40)
LDL Cholesterol: 118 mg/dL (ref <130)
Triglycerides: 295 mg/dL — ABNORMAL HIGH (ref <150)
VLDL: 59 mg/dL — ABNORMAL HIGH (ref <30)

## 2015-10-18 MED ORDER — KETOROLAC TROMETHAMINE 60 MG/2ML IM SOLN
60.0000 mg | Freq: Once | INTRAMUSCULAR | Status: AC
  Administered 2015-10-18: 60 mg via INTRAMUSCULAR

## 2015-10-18 MED ORDER — CYCLOBENZAPRINE HCL 10 MG PO TABS: 10.0000 mg | ORAL_TABLET | Freq: Three times a day (TID) | ORAL | 0 refills | Status: DC | PRN

## 2015-10-18 NOTE — Progress Notes (Signed)
Pre visit review using our clinic review tool, if applicable. No additional management support is needed unless otherwise documented below in the visit note. 

## 2015-10-18 NOTE — Patient Instructions (Addendum)
EXERCISES  RANGE OF MOTION (ROM) AND STRETCHING EXERCISES  Most people with back pain will find that their symptoms get worse with excessive bending forward (flexion) or arching at the lower back (extension). The exercises that will help resolve your symptoms will focus on the opposite motion.  Your physician, physical therapist or athletic trainer will help you determine which exercises will be most helpful to resolve your back pain. Do not complete any exercises without first consulting with your caregiver. Discontinue any exercises which make your symptoms worse, until you speak to your caregiver. If you have pain, numbness or tingling which travels down into your buttocks, leg or foot, the goal of the therapy is for these symptoms to move closer to your back and eventually resolve. Sometimes, these leg symptoms will get better, but your lower back pain may worsen. This is often an indication of progress in your rehabilitation. Be very alert to any changes in your symptoms and the activities in which you participated in the 24 hours prior to the change. Sharing this information with your caregiver will allow him or her to most efficiently treat your condition. These exercises may help you when beginning to rehabilitate your injury. Your symptoms may resolve with or without further involvement from your physician, physical therapist or athletic trainer. While completing these exercises, remember:   Restoring tissue flexibility helps normal motion to return to the joints. This allows healthier, less painful movement and activity.  An effective stretch should be held for at least 30 seconds.  A stretch should never be painful. You should only feel a gentle lengthening or release in the stretched tissue. FLEXION RANGE OF MOTION AND STRETCHING EXERCISES:  STRETCH - Flexion, Single Knee to Chest   Lie on a firm bed or floor with both legs extended in front of you.  Keeping one leg in contact with the  floor, bring your opposite knee to your chest. Hold your leg in place by either grabbing behind your thigh or at your knee.  Pull until you feel a gentle stretch in your low back. Hold __________ seconds.  Slowly release your grasp and repeat the exercise with the opposite side. Repeat __________ times. Complete this exercise __________ times per day.   STRETCH - Flexion, Double Knee to Chest  Lie on a firm bed or floor with both legs extended in front of you.  Keeping one leg in contact with the floor, bring your opposite knee to your chest.  Tense your stomach muscles to support your back and then lift your other knee to your chest. Hold your legs in place by either grabbing behind your thighs or at your knees.  Pull both knees toward your chest until you feel a gentle stretch in your low back. Hold __________ seconds.  Tense your stomach muscles and slowly return one leg at a time to the floor. Repeat __________ times. Complete this exercise __________ times per day.   STRETCH - Low Trunk Rotation  Lie on a firm bed or floor. Keeping your legs in front of you, bend your knees so they are both pointed toward the ceiling and your feet are flat on the floor.  Extend your arms out to the side. This will stabilize your upper body by keeping your shoulders in contact with the floor.  Gently and slowly drop both knees together to one side until you feel a gentle stretch in your low back. Hold for __________ seconds.  Tense your stomach muscles to support your  lower back as you bring your knees back to the starting position. Repeat the exercise to the other side. Repeat __________ times. Complete this exercise __________ times per day  EXTENSION RANGE OF MOTION AND FLEXIBILITY EXERCISES:  STRETCH - Extension, Prone on Elbows   Lie on your stomach on the floor, a bed will be too soft. Place your palms about shoulder width apart and at the height of your head.  Place your elbows under  your shoulders. If this is too painful, stack pillows under your chest.  Allow your body to relax so that your hips drop lower and make contact more completely with the floor.  Hold this position for __________ seconds.  Slowly return to lying flat on the floor. Repeat __________ times. Complete this exercise __________ times per day.   RANGE OF MOTION - Extension, Prone Press Ups  Lie on your stomach on the floor, a bed will be too soft. Place your palms about shoulder width apart and at the height of your head.  Keeping your back as relaxed as possible, slowly straighten your elbows while keeping your hips on the floor. You may adjust the placement of your hands to maximize your comfort. As you gain motion, your hands will come more underneath your shoulders.  Hold this position __________ seconds.  Slowly return to lying flat on the floor. Repeat __________ times. Complete this exercise __________ times per day.   RANGE OF MOTION- Quadruped, Neutral Spine   Assume a hands and knees position on a firm surface. Keep your hands under your shoulders and your knees under your hips. You may place padding under your knees for comfort.  Drop your head and point your tailbone toward the ground below you. This will round out your lower back like an angry cat. Hold this position for __________ seconds.  Slowly lift your head and release your tail bone so that your back sags into a large arch, like an old horse.  Hold this position for __________ seconds.  Repeat this until you feel limber in your low back.  Now, find your "sweet spot." This will be the most comfortable position somewhere between the two previous positions. This is your neutral spine. Once you have found this position, tense your stomach muscles to support your low back.  Hold this position for __________ seconds. Repeat __________ times. Complete this exercise __________ times per day.  STRENGTHENING EXERCISES - Low Back  Sprain These exercises may help you when beginning to rehabilitate your injury. These exercises should be done near your "sweet spot." This is the neutral, low-back arch, somewhere between fully rounded and fully arched, that is your least painful position. When performed in this safe range of motion, these exercises can be used for people who have either a flexion or extension based injury. These exercises may resolve your symptoms with or without further involvement from your physician, physical therapist or athletic trainer. While completing these exercises, remember:   Muscles can gain both the endurance and the strength needed for everyday activities through controlled exercises.  Complete these exercises as instructed by your physician, physical therapist or athletic trainer. Increase the resistance and repetitions only as guided.  You may experience muscle soreness or fatigue, but the pain or discomfort you are trying to eliminate should never worsen during these exercises. If this pain does worsen, stop and make certain you are following the directions exactly. If the pain is still present after adjustments, discontinue the exercise until you can discuss  the trouble with your caregiver.  STRENGTHENING - Deep Abdominals, Pelvic Tilt   Lie on a firm bed or floor. Keeping your legs in front of you, bend your knees so they are both pointed toward the ceiling and your feet are flat on the floor.  Tense your lower abdominal muscles to press your low back into the floor. This motion will rotate your pelvis so that your tail bone is scooping upwards rather than pointing at your feet or into the floor. With a gentle tension and even breathing, hold this position for __________ seconds. Repeat __________ times. Complete this exercise __________ times per day.   STRENGTHENING - Abdominals, Crunches   Lie on a firm bed or floor. Keeping your legs in front of you, bend your knees so they are both  pointed toward the ceiling and your feet are flat on the floor. Cross your arms over your chest.  Slightly tip your chin down without bending your neck.  Tense your abdominals and slowly lift your trunk high enough to just clear your shoulder blades. Lifting higher can put excessive stress on the lower back and does not further strengthen your abdominal muscles.  Control your return to the starting position. Repeat __________ times. Complete this exercise __________ times per day.   STRENGTHENING - Quadruped, Opposite UE/LE Lift   Assume a hands and knees position on a firm surface. Keep your hands under your shoulders and your knees under your hips. You may place padding under your knees for comfort.  Find your neutral spine and gently tense your abdominal muscles so that you can maintain this position. Your shoulders and hips should form a rectangle that is parallel with the floor and is not twisted.  Keeping your trunk steady, lift your right hand no higher than your shoulder and then your left leg no higher than your hip. Make sure you are not holding your breath. Hold this position for __________ seconds.  Continuing to keep your abdominal muscles tense and your back steady, slowly return to your starting position. Repeat with the opposite arm and leg. Repeat __________ times. Complete this exercise __________ times per day.   STRENGTHENING - Abdominals and Quadriceps, Straight Leg Raise   Lie on a firm bed or floor with both legs extended in front of you.  Keeping one leg in contact with the floor, bend the other knee so that your foot can rest flat on the floor.  Find your neutral spine, and tense your abdominal muscles to maintain your spinal position throughout the exercise.  Slowly lift your straight leg off the floor about 6 inches for a count of 15, making sure to not hold your breath.  Still keeping your neutral spine, slowly lower your leg all the way to the floor. Repeat  this exercise with each leg __________ times. Complete this exercise __________ times per day. POSTURE AND BODY MECHANICS CONSIDERATIONS - Low Back Sprain Keeping correct posture when sitting, standing or completing your activities will reduce the stress put on different body tissues, allowing injured tissues a chance to heal and limiting painful experiences. The following are general guidelines for improved posture. Your physician or physical therapist will provide you with any instructions specific to your needs. While reading these guidelines, remember:  The exercises prescribed by your provider will help you have the flexibility and strength to maintain correct postures.  The correct posture provides the best environment for your joints to work. All of your joints have less wear and  tear when properly supported by a spine with good posture. This means you will experience a healthier, less painful body.  Correct posture must be practiced with all of your activities, especially prolonged sitting and standing. Correct posture is as important when doing repetitive low-stress activities (typing) as it is when doing a single heavy-load activity (lifting).  RESTING POSITIONS Consider which positions are most painful for you when choosing a resting position. If you have pain with flexion-based activities (sitting, bending, stooping, squatting), choose a position that allows you to rest in a less flexed posture. You would want to avoid curling into a fetal position on your side. If your pain worsens with extension-based activities (prolonged standing, working overhead), avoid resting in an extended position such as sleeping on your stomach. Most people will find more comfort when they rest with their spine in a more neutral position, neither too rounded nor too arched. Lying on a non-sagging bed on your side with a pillow between your knees, or on your back with a pillow under your knees will often provide  some relief. Keep in mind, being in any one position for a prolonged period of time, no matter how correct your posture, can still lead to stiffness. PROPER SITTING POSTURE In order to minimize stress and discomfort on your spine, you must sit with correct posture. Sitting with good posture should be effortless for a healthy body. Returning to good posture is a gradual process. Many people can work toward this most comfortably by using various supports until they have the flexibility and strength to maintain this posture on their own. When sitting with proper posture, your ears will fall over your shoulders and your shoulders will fall over your hips. You should use the back of the chair to support your upper back. Your lower back will be in a neutral position, just slightly arched. You may place a small pillow or folded towel at the base of your lower back for  support.  When working at a desk, create an environment that supports good, upright posture. Without extra support, muscles tire, which leads to excessive strain on joints and other tissues. Keep these recommendations in mind:  CHAIR:  A chair should be able to slide under your desk when your back makes contact with the back of the chair. This allows you to work closely.  The chair's height should allow your eyes to be level with the upper part of your monitor and your hands to be slightly lower than your elbows.  BODY POSITION  Your feet should make contact with the floor. If this is not possible, use a foot rest.  Keep your ears over your shoulders. This will reduce stress on your neck and low back.  INCORRECT SITTING POSTURES  If you are feeling tired and unable to assume a healthy sitting posture, do not slouch or slump. This puts excessive strain on your back tissues, causing more damage and pain. Healthier options include:  Using more support, like a lumbar pillow.  Switching tasks to something that requires you to be upright or  walking.  Talking a brief walk.  Lying down to rest in a neutral-spine position.  PROLONGED STANDING WHILE SLIGHTLY LEANING FORWARD  When completing a task that requires you to lean forward while standing in one place for a long time, place either foot up on a stationary 2-4 inch high object to help maintain the best posture. When both feet are on the ground, the lower back tends to  lose its slight inward curve. If this curve flattens (or becomes too large), then the back and your other joints will experience too much stress, tire more quickly, and can cause pain.  CORRECT STANDING POSTURES Proper standing posture should be assumed with all daily activities, even if they only take a few moments, like when brushing your teeth. As in sitting, your ears should fall over your shoulders and your shoulders should fall over your hips. You should keep a slight tension in your abdominal muscles to brace your spine. Your tailbone should point down to the ground, not behind your body, resulting in an over-extended swayback posture.   INCORRECT STANDING POSTURES  Common incorrect standing postures include a forward head, locked knees and/or an excessive swayback. WALKING Walk with an upright posture. Your ears, shoulders and hips should all line-up.  PROLONGED ACTIVITY IN A FLEXED POSITION When completing a task that requires you to bend forward at your waist or lean over a low surface, try to find a way to stabilize 3 out of 4 of your limbs. You can place a hand or elbow on your thigh or rest a knee on the surface you are reaching across. This will provide you more stability, so that your muscles do not tire as quickly. By keeping your knees relaxed, or slightly bent, you will also reduce stress across your lower back. CORRECT LIFTING TECHNIQUES  DO :  Assume a wide stance. This will provide you more stability and the opportunity to get as close as possible to the object which you are lifting.  Tense  your abdominals to brace your spine. Bend at the knees and hips. Keeping your back locked in a neutral-spine position, lift using your leg muscles. Lift with your legs, keeping your back straight.  Test the weight of unknown objects before attempting to lift them.  Try to keep your elbows locked down at your sides in order get the best strength from your shoulders when carrying an object.     Always ask for help when lifting heavy or awkward objects. INCORRECT LIFTING TECHNIQUES DO NOT:   Lock your knees when lifting, even if it is a small object.  Bend and twist. Pivot at your feet or move your feet when needing to change directions.  Assume that you can safely pick up even a paperclip without proper posture.
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
## 2015-10-19 LAB — HEMOGLOBIN A1C
HEMOGLOBIN A1C: 5.1 % (ref <5.7)
MEAN PLASMA GLUCOSE: 100 mg/dL

## 2015-10-19 LAB — HIV ANTIBODY (ROUTINE TESTING W REFLEX): HIV 1&2 Ab, 4th Generation: NONREACTIVE

## 2016-02-10 ENCOUNTER — Emergency Department (HOSPITAL_BASED_OUTPATIENT_CLINIC_OR_DEPARTMENT_OTHER)
Admission: EM | Admit: 2016-02-10 | Discharge: 2016-02-10 | Disposition: A | Discharge status: Home / Self Care | Payer: BLUE CROSS/BLUE SHIELD | Attending: Emergency Medicine | Admitting: Emergency Medicine

## 2016-02-10 ENCOUNTER — Encounter (HOSPITAL_BASED_OUTPATIENT_CLINIC_OR_DEPARTMENT_OTHER): Payer: Self-pay | Admitting: *Deleted

## 2016-02-10 ENCOUNTER — Emergency Department (HOSPITAL_BASED_OUTPATIENT_CLINIC_OR_DEPARTMENT_OTHER): Payer: BLUE CROSS/BLUE SHIELD

## 2016-02-10 DIAGNOSIS — R1013 Epigastric pain: Secondary | ICD-10-CM | POA: Diagnosis not present

## 2016-02-10 DIAGNOSIS — I10 Essential (primary) hypertension: Secondary | ICD-10-CM | POA: Diagnosis not present

## 2016-02-10 DIAGNOSIS — R0789 Other chest pain: Secondary | ICD-10-CM | POA: Diagnosis not present

## 2016-02-10 DIAGNOSIS — R079 Chest pain, unspecified: Secondary | ICD-10-CM

## 2016-02-10 DIAGNOSIS — M545 Low back pain, unspecified: Secondary | ICD-10-CM

## 2016-02-10 DIAGNOSIS — M546 Pain in thoracic spine: Secondary | ICD-10-CM

## 2016-02-10 LAB — CBC WITH DIFFERENTIAL/PLATELET
BASOS ABS: 0 10*3/uL (ref 0.0–0.1)
BASOS PCT: 1 %
EOS PCT: 1 %
Eosinophils Absolute: 0.1 10*3/uL (ref 0.0–0.7)
HCT: 50.6 % (ref 39.0–52.0)
Hemoglobin: 16.7 g/dL (ref 13.0–17.0)
Lymphocytes Relative: 47 %
Lymphs Abs: 1.9 10*3/uL (ref 0.7–4.0)
MCH: 30 pg (ref 26.0–34.0)
MCHC: 33 g/dL (ref 30.0–36.0)
MCV: 90.8 fL (ref 78.0–100.0)
MONO ABS: 0.4 10*3/uL (ref 0.1–1.0)
Monocytes Relative: 10 %
NEUTROS ABS: 1.7 10*3/uL (ref 1.7–7.7)
Neutrophils Relative %: 41 %
Platelets: 293 10*3/uL (ref 150–400)
RBC: 5.57 MIL/uL (ref 4.22–5.81)
RDW: 12.1 % (ref 11.5–15.5)
WBC: 4.1 10*3/uL (ref 4.0–10.5)

## 2016-02-10 LAB — TROPONIN I

## 2016-02-10 LAB — BASIC METABOLIC PANEL
ANION GAP: 6 (ref 5–15)
BUN: 9 mg/dL (ref 6–20)
CALCIUM: 9.3 mg/dL (ref 8.9–10.3)
CO2: 28 mmol/L (ref 22–32)
Chloride: 102 mmol/L (ref 101–111)
Creatinine, Ser: 1.01 mg/dL (ref 0.61–1.24)
Glucose, Bld: 133 mg/dL — ABNORMAL HIGH (ref 65–99)
POTASSIUM: 3.8 mmol/L (ref 3.5–5.1)
Sodium: 136 mmol/L (ref 135–145)

## 2016-02-10 LAB — HEPATIC FUNCTION PANEL
ALBUMIN: 4.2 g/dL (ref 3.5–5.0)
ALT: 19 U/L (ref 17–63)
AST: 25 U/L (ref 15–41)
Alkaline Phosphatase: 67 U/L (ref 38–126)
Bilirubin, Direct: <0.1 mg/dL — ABNORMAL LOW (ref 0.1–0.5)
TOTAL PROTEIN: 7.8 g/dL (ref 6.5–8.1)
Total Bilirubin: 0.7 mg/dL (ref 0.3–1.2)

## 2016-02-10 LAB — LIPASE, BLOOD: LIPASE: 22 U/L (ref 11–51)

## 2016-02-10 LAB — D-DIMER, QUANTITATIVE: D-Dimer, Quant: 0.3 ug/mL-FEU (ref 0.00–0.50)

## 2016-02-10 MED ORDER — GI COCKTAIL ~~LOC~~
30.0000 mL | Freq: Once | ORAL | Status: AC
  Administered 2016-02-10: 30 mL via ORAL
  Filled 2016-02-10: qty 30

## 2016-02-10 MED ORDER — CYCLOBENZAPRINE HCL 10 MG PO TABS: 10.0000 mg | ORAL_TABLET | Freq: Every day | ORAL | 0 refills | Status: DC

## 2016-02-10 MED ORDER — METHOCARBAMOL 500 MG PO TABS
500.0000 mg | ORAL_TABLET | Freq: Once | ORAL | Status: AC
Start: 2016-02-10 — End: 2016-02-10
  Administered 2016-02-10: 500 mg via ORAL
  Filled 2016-02-10: qty 1

## 2016-02-10 NOTE — ED Notes (Signed)
Family at bedside. 

## 2016-02-10 NOTE — ED Triage Notes (Signed)
Chest pain into his left arm and upper back x 3 days. Epigastric pain. States his neck has been swelling. No injury. Lower back pain. Feels like gas movement per pts wife.

## 2016-02-10 NOTE — ED Notes (Signed)
Patient is resting comfortably. 
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
## 2016-02-10 NOTE — ED Provider Notes (Signed)
[facility] Provider Note   CSN: [id number] Arrival date & time: 02/10/16  1500  By signing my name below, I, Arianna Nassar, attest that this documentation has been prepared under the direction and in the presence of Nira Conn, MD.  Electronically Signed: Octavia Heir, ED Scribe. 02/10/16. 5:30 PM.    History   Chief Complaint Chief Complaint  Patient presents with  . Chest Pain    The history is provided by the patient. No language interpreter was used.   HPI Comments: Trevor Lewis is a 36 y.o. male who has a PMhx of GERD, HTN, and pericardial effusion presents to the Emergency Department complaining of moderate, gradual worsening, intermittent left sided chest pain x 4 days. He notes associated left sided neck swelling, left arm numbness/tingling with movement, left shoulder pain, left upper back pain with no injury, and left leg pain that has resolved. He further notes having one episode of vomiting yesterday and epigastric abdominal pain that is worse with palpation. He reports having a hx of pericardial effusion 5 years ago and reports being checked one year ago in which has been resolved since 2015. Pt notes that he does frequent heavy lifting at his job. He has had similar pain in the past and reports the presentation is very similar to prior episodes. He has been taking 400-600 mg of ibuprofen and baby aspirin to alleviate his pain without relief. Pt also reports taking Zantac yesterday to relieve his GERD symptoms with relief. He does not have a family hx of CAD below the age of 61, hx of high cholesterol, and hx of DM. He notes traveling to [place] one month ago. Pt denies shortness of breath, leg swelling, blood in stool, or melena. Pt is a non-smoker.  Pt also expresses that he has been constipated recently. He does not take frequent pain medications nor has he taken any laxatives to relieve his symptoms.   Cardiologist: Dr. Lenora Boys Past [facility]  History:  Diagnosis Date  . GERD (gastroesophageal reflux disease)   . Hypertension   . Pericardial effusion   . TB lung, latent     There are no active problems to display for this patient.   History reviewed. No pertinent surgical history.     Home Medications    Prior to Admission medications   Medication Sig Start Date End Date Taking? Authorizing Provider  cyclobenzaprine (FLEXERIL) 10 MG tablet Take 1 tablet (10 mg total) by mouth at bedtime. 02/10/16 02/20/16  Nira Conn, MD  ibuprofen (ADVIL,MOTRIN) 800 MG tablet Take 800 mg by mouth every 8 (eight) hours. 10/12/15   Historical Provider, MD    Family History No family history on file.  Social History Social History  Substance Use Topics  . Smoking status: Never Smoker  . Smokeless tobacco: Never Used  . Alcohol use No     Allergies   Patient has no known allergies.   Review of Systems Review of Systems  A complete 10 system review of systems was obtained and all systems are negative except as noted in the HPI and PMH.   Physical Exam Updated Vital Signs Initial vitals: BP 142/96   Pulse 75   Temp 98.3 F (36.8 C) (Oral)   Resp 17   Ht 5\' 11"  (1.803 m)   Wt 210 lb (95.3 kg)   SpO2 97%   BMI 29.29 kg/m   Physical Exam  Constitutional: He is oriented to person, place, and time. He appears well-developed  and well-nourished. No distress.  HENT:  Head: Normocephalic and atraumatic.  Nose: Nose normal.  Eyes: Conjunctivae and EOM are normal. Pupils are equal, round, and reactive to light. Right eye exhibits no discharge. Left eye exhibits no discharge. No scleral icterus.  Neck: Normal range of motion. Neck supple.  Cardiovascular: Normal rate and regular rhythm.  Exam reveals no gallop and no friction rub.   No murmur heard. Pulmonary/Chest: Effort normal and breath sounds normal. No stridor. No respiratory distress. He has no rales.  Abdominal: Soft. He exhibits no distension. There is  tenderness (mild discomfort) in the epigastric area.  Musculoskeletal: He exhibits tenderness. He exhibits no edema.  Left pariscapular muscle tederness  Neurological: He is alert and oriented to person, place, and time.  Skin: Skin is warm and dry. No rash noted. He is not diaphoretic. No erythema.  Psychiatric: He has a normal mood and affect.  Vitals reviewed.    ED Treatments / Results  DIAGNOSTIC STUDIES: Oxygen Saturation is 97% on RA, normal by my interpretation.  COORDINATION OF CARE:  5:20 PM Discussed treatment plan with pt at bedside and pt agreed to plan.  Labs (all labs ordered are listed, but only abnormal results are displayed) Labs Reviewed  BASIC METABOLIC PANEL - Abnormal; Notable for the following:       Result Value   Glucose, Bld 133 (*)    All other components within normal limits  HEPATIC FUNCTION PANEL - Abnormal; Notable for the following:    Bilirubin, Direct <0.1 (*)    All other components within normal limits  TROPONIN I  CBC WITH DIFFERENTIAL/PLATELET  LIPASE, BLOOD  D-DIMER, QUANTITATIVE (NOT AT [facility])    EKG  EKG Interpretation  Date/Time:  Monday February 10 2016 15:09:04 EST Ventricular Rate:  75 PR Interval:  138 QRS Duration: 80 QT Interval:  350 QTC Calculation: 390 R Axis:   -53 Text Interpretation:  Normal sinus rhythm Possible Left atrial enlargement Left axis deviation Pulmonary disease pattern Nonspecific T wave abnormality Abnormal ECG since last tracing no significant change Confirmed by BELFI  MD, MELANIE ([id number]) on 02/10/2016 3:18:41 PM       Radiology Dg Chest 2 View  Result Date: 02/10/2016 CLINICAL DATA:  Chest and abdominal pain since Thursday. EXAM: CHEST  2 VIEW COMPARISON:  Chest x-ray dated 08/17/2015. FINDINGS: Cardiomediastinal silhouette is normal in size and configuration. Lungs are clear. Lung volumes are normal. No evidence of pneumonia. No pleural effusion. No pneumothorax. Osseous and soft tissue structures  about the chest are unremarkable. IMPRESSION: Normal chest x-ray. Electronically Signed   By: Bary Richard M.D.   On: 02/10/2016 17:39    Procedures Procedures (including critical care time)  Medications Ordered in ED Medications  gi cocktail (Maalox,Lidocaine,Donnatal) (30 mLs Oral Given 02/10/16 1813)  methocarbamol (ROBAXIN) tablet 500 mg (500 mg Oral Given 02/10/16 1812)     Initial Impression / Assessment and Plan / ED Course  I have reviewed the triage vital signs and the nursing notes.  Pertinent labs & imaging results that were available during my care of the patient were reviewed by me and considered in my medical decision making (see chart for details).  Clinical Course     1. Atypical chest pain EKG without acute ischemic changes or evidence of pericarditis. Chest x-ray without evidence suggestive of pneumonia, pneumothorax, pneumomediastinum.  No abnormal contour of the mediastinum to suggest dissection. No evidence of acute injuries. Troponin negative. Given the fact that this pain is  been ongoing since Thursday feel that a single troponin is sufficient to rule out ACS.  D-dimer obtained given the recent long distance travel; negative. Doubt pulmonary embolism him.  Presentation not classic for aortic dissection or esophageal perforation.  Given tenderness to palpation of the left parascapular muscles, likely MSK in nature.  2. Epigastric pain Minimal epigastric discomfort on palpation. Labs reassuring without evidence of pancreatitis, early obstruction. Symptoms improved with GI cocktail. Likely gastritis. Low suspicion for small bowel obstruction or other serious intra-abdominal etiologies. Patient has been able to tolerate by mouth.  The patient is safe for discharge with strict return precautions.    Final Clinical Impressions(s) / ED Diagnoses   Final diagnoses:  Chest pain  Epigastric pain   Disposition: Discharge  Condition: Good  I have discussed the  results, Dx and Tx plan with the patient who expressed understanding and agree(s) with the plan. Discharge instructions discussed at great length. The patient was given strict return precautions who verbalized understanding of the instructions. No further questions at time of discharge.    Current Discharge Medication List      Follow Up: Sharlene Doryicholas Paul Wendling, DO [address] [place] [id number] [phone]  Schedule an appointment as soon as possible for a visit  in 5-7 days, If symptoms do not improve or  worsen   I personally performed the services described in this documentation, which was scribed in my presence. The recorded information has been reviewed and is accurate.        Nira ConnPedro Eduardo Hanaa Payes, MD 02/10/16 [phone]

## 2016-02-12 ENCOUNTER — Ambulatory Visit: Payer: BLUE CROSS/BLUE SHIELD | Admitting: Family Medicine

## 2016-02-14 ENCOUNTER — Ambulatory Visit: Payer: BLUE CROSS/BLUE SHIELD | Admitting: Family Medicine

## 2016-02-17 ENCOUNTER — Encounter: Payer: Self-pay | Admitting: Family Medicine

## 2016-02-17 ENCOUNTER — Ambulatory Visit (INDEPENDENT_AMBULATORY_CARE_PROVIDER_SITE_OTHER): Payer: BLUE CROSS/BLUE SHIELD | Admitting: Family Medicine

## 2016-02-17 VITALS — BP 154/104 | HR 73 | Temp 98.2°F | Resp 16 | Ht 71.0 in | Wt 217.2 lb

## 2016-02-17 DIAGNOSIS — R030 Elevated blood-pressure reading, without diagnosis of hypertension: Secondary | ICD-10-CM

## 2016-02-17 DIAGNOSIS — G5602 Carpal tunnel syndrome, left upper limb: Secondary | ICD-10-CM | POA: Diagnosis not present

## 2016-02-17 DIAGNOSIS — K219 Gastro-esophageal reflux disease without esophagitis: Secondary | ICD-10-CM

## 2016-02-17 DIAGNOSIS — G5600 Carpal tunnel syndrome, unspecified upper limb: Secondary | ICD-10-CM

## 2016-02-17 HISTORY — DX: Gastro-esophageal reflux disease without esophagitis: K21.9

## 2016-02-17 HISTORY — DX: Carpal tunnel syndrome, unspecified upper limb: G56.00

## 2016-02-17 NOTE — Progress Notes (Signed)
Pre visit review using our clinic review tool, if applicable. No additional management support is needed unless otherwise documented below in the visit note. 
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
## 2016-02-17 NOTE — Patient Instructions (Addendum)
Blood pressure- record your blood pressure 3-4 times weekly. Write down the values and bring them to your nurse visit in 2 weeks. Dedicate some time outside of work to exercise. On average, aim for 5 days per week, around 30 minutes each day. Anything is better than nothing, however.  Reflux- start taking omeprazole 20 mg twice daily. This is available over the counter. Be mindful of foods that worsen discomfort associated with this. You may have to prop some pillows under yourself when you sleep so you sleep elevated. Weight loss will also help with this.

## 2016-02-24 ENCOUNTER — Encounter: Payer: Self-pay | Admitting: Family Medicine

## 2016-02-24 ENCOUNTER — Ambulatory Visit (INDEPENDENT_AMBULATORY_CARE_PROVIDER_SITE_OTHER): Payer: BLUE CROSS/BLUE SHIELD | Admitting: Family Medicine

## 2016-02-24 VITALS — BP 143/96 | HR 84 | Temp 98.6°F | Ht 71.0 in | Wt 217.8 lb

## 2016-02-24 DIAGNOSIS — R5383 Other fatigue: Secondary | ICD-10-CM | POA: Diagnosis not present

## 2016-02-24 DIAGNOSIS — R0683 Snoring: Secondary | ICD-10-CM | POA: Diagnosis not present

## 2016-02-24 DIAGNOSIS — I10 Essential (primary) hypertension: Secondary | ICD-10-CM

## 2016-02-24 DIAGNOSIS — R1013 Epigastric pain: Secondary | ICD-10-CM | POA: Diagnosis not present

## 2016-02-24 MED ORDER — LISINOPRIL 20 MG PO TABS: 20.0000 mg | ORAL_TABLET | Freq: Every day | ORAL | 3 refills | Status: DC

## 2016-02-24 NOTE — Progress Notes (Signed)
Pre visit review using our clinic review tool, if applicable. No additional management support is needed unless otherwise documented below in the visit note. 

## 2016-02-24 NOTE — Patient Instructions (Signed)

## 2016-02-24 NOTE — Progress Notes (Signed)
Chief Complaint  Patient presents with  . Follow-up    BP    Trevor Lewis is here for epigastric abdominal pain.  Duration: 2 weeks  Burning Unsure how long it lasts 8-9/10 Comes and goes- couple hours Nighttime awakenings? Yes Bleeding? No Weight loss? 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 He does snore at night and admits to daytime fatigue. He does not monitor his BP at home. He is trying to adhere to a healthy diet.  He does not exercise routinely.  ROS: Constitutional: No fevers GI: No N/V/D/C, no bleeding + pain  Past Medical History:  Diagnosis Date  . Carpal tunnel syndrome 02/17/2016  . GERD (gastroesophageal reflux disease)   . GERD (gastroesophageal reflux disease) 02/17/2016  . Hypertension   . Pericardial effusion   . TB lung, latent    History reviewed. No pertinent family history. Past Surgical History:  Procedure Laterality Date  . NO PAST SURGERIES      BP (!) 143/96 (BP Location: Left Arm, Patient Position: Sitting, Cuff Size: Large)   Pulse 84   Temp 98.6 F (37 C) (Oral)   Ht 5\' 11"  (1.803 m)   Wt 217 lb 12.8 oz (98.8 kg)   SpO2 98%   BMI 30.38 kg/m  Gen.: Awake, alert, appears stated age HEENT: Mucous membranes moist without mucosal lesions Heart: Regular rate and rhythm without murmurs Lungs: Clear auscultation bilaterally, no rales or wheezing, normal effort without accessory muscle use. Abdomen: Bowel sounds are present. Abdomen is soft, TTP in epigastric region, nondistended, no masses or organomegaly. Negative Murphy's, Rovsing's, McBurney's, and Carnett's sign. Psych: Age appropriate judgment and insight. Normal mood and affect.  Epigastric abdominal pain - Plan: Ambulatory referral to Gastroenterology  Essential  hypertension - Plan: lisinopril (PRINIVIL,ZESTRIL) 20 MG tablet, Basic Metabolic Panel (BMET), Basic Metabolic Panel (BMET)  Fatigue, unspecified type - Plan: Ambulatory referral to Pulmonology  Snoring - Plan: Ambulatory referral to Pulmonology  Orders as above. Start lisinopril. Due to nighttime awakenings and increased pain when eating, will refer to gastroenterology for possible EGD. OK to increase PPI until then. F/u in 4 weeks. 1 week for labs. Pt voiced understanding and agreement to the plan.  Jilda Rocheicholas Paul [place], DO 02/24/16 4:56 PM

## 2016-02-25 LAB — BASIC METABOLIC PANEL
BUN: 9 mg/dL (ref 6–23)
CHLORIDE: 101 meq/L (ref 96–112)
CO2: 32 mEq/L (ref 19–32)
Calcium: 9 mg/dL (ref 8.4–10.5)
Creatinine, Ser: 1.02 mg/dL (ref 0.40–1.50)
GFR: 105.16 mL/min (ref >60.00)
Glucose, Bld: 108 mg/dL — ABNORMAL HIGH (ref 70–99)
POTASSIUM: 4.5 meq/L (ref 3.5–5.1)
Sodium: 136 mEq/L (ref 135–145)

## 2016-02-26 ENCOUNTER — Encounter: Payer: Self-pay | Admitting: Internal Medicine

## 2016-03-02 ENCOUNTER — Other Ambulatory Visit (INDEPENDENT_AMBULATORY_CARE_PROVIDER_SITE_OTHER): Payer: BLUE CROSS/BLUE SHIELD

## 2016-03-02 DIAGNOSIS — I10 Essential (primary) hypertension: Secondary | ICD-10-CM | POA: Diagnosis not present

## 2016-03-03 LAB — BASIC METABOLIC PANEL
BUN: 11 mg/dL (ref 6–23)
CHLORIDE: 105 meq/L (ref 96–112)
CO2: 30 meq/L (ref 19–32)
Calcium: 9 mg/dL (ref 8.4–10.5)
Creatinine, Ser: 1.08 mg/dL (ref 0.40–1.50)
GFR: 98.44 mL/min (ref >60.00)
Glucose, Bld: 75 mg/dL (ref 70–99)
Potassium: 4.3 mEq/L (ref 3.5–5.1)
Sodium: 140 mEq/L (ref 135–145)

## 2016-03-04 ENCOUNTER — Ambulatory Visit (INDEPENDENT_AMBULATORY_CARE_PROVIDER_SITE_OTHER): Payer: BLUE CROSS/BLUE SHIELD | Admitting: Internal Medicine

## 2016-03-04 ENCOUNTER — Encounter: Payer: Self-pay | Admitting: Internal Medicine

## 2016-03-04 ENCOUNTER — Encounter: Payer: Self-pay | Admitting: *Deleted

## 2016-03-04 VITALS — BP 100/80 | HR 68 | Ht 71.0 in | Wt 214.2 lb

## 2016-03-04 DIAGNOSIS — K219 Gastro-esophageal reflux disease without esophagitis: Secondary | ICD-10-CM | POA: Diagnosis not present

## 2016-03-04 DIAGNOSIS — R1013 Epigastric pain: Secondary | ICD-10-CM

## 2016-03-04 MED ORDER — PANTOPRAZOLE SODIUM 40 MG PO TBEC: 40.0000 mg | DELAYED_RELEASE_TABLET | Freq: Every day | ORAL | 3 refills | Status: DC

## 2016-03-04 NOTE — Patient Instructions (Addendum)
  Please call back in 2 weeks and let me know if you are:  Same, better or worse.  Pick up the prescription for new acid blocking medication at the pharmacy.  Today we are giving you a work note.  I appreciate the opportunity to care for you. Iva Booparl E. Jaicee Michelotti, MD, Clementeen GrahamFACG

## 2016-03-04 NOTE — Progress Notes (Signed)
Trevor Lewis 36 y.o. 1980-10-01 [id number] Referred by: Sharlene Dory*  Assessment & Plan:   Encounter Diagnoses  Name Primary?  . Abdominal pain, epigastric Yes  . Gastroesophageal reflux disease, esophagitis presence not specified     Most likely scenario is NSAID-induced gastritis vs ulcer. No worrisome features at this point. Discussed options of increasing PPI and reassess by phone in 2 weeks vs scheduling EGD. We chose wait 2 weeks, start 40 mg pantoprazole now.  If not sig better in 2 weeks anticipate an EGD.  At some point I think it would be reasonable to check for H pylori either by stool Ag or breath test (NOT serology) off PPI x 2 weeks when clincally appropriate.  I appreciate the opportunity to care for this patient. Cc;Nicholas Carren Rang, DO    Subjective:   Chief Complaint: Epigastric pain  HPI The patient is a very nice middle-aged man originally from [place], with a several week or more history of epigastric pain. He has a diagnosis of heartburn or GERD and takes over-the-counter omeprazole intermittently prior to recent times, with decent relief. He said that in the past few months he had increasing problems with heartburn, mild odynophagia symptoms and epigastric pain. He has also been taking NSAIDs for back trouble. He went to the emergency department in January to review that visit, he thought his symptoms were reminiscent of when he had a pericardial effusion in 2013. Chest x-ray other labs etc. were okay and they did not think he had an effusion. Subsequent he saw primary care who recommended he take his omeprazole daily and has been doing that for 2 weeks in the odynophagia symptoms are gone though he still has epigastric pain. He feels like he is a little better. Leading. No anemia. In 2013 when he was having problems with upper [place] effusion he did have an upper GI endoscopy that showed a hiatal hernia, otherwise negative. He reports having a  colonoscopy as well though I don't see records of that I did see documentation of the upper endoscopy which was done at [facility]. This information is seen in emergency room note from October 2013.  Current Meds  Medication Sig  . lisinopril (PRINIVIL,ZESTRIL) 20 MG tablet Take 1 tablet (20 mg total) by mouth daily.  . [DISCONTINUED] omeprazole (PRILOSEC) 20 MG capsule Take 20 mg by mouth 2 (two) times daily before a meal.   No Known Allergies Past Medical History:  Diagnosis Date  . Carpal tunnel syndrome 02/17/2016  . GERD (gastroesophageal reflux disease) 02/17/2016  . Hypertension   . Pericardial effusion   . TB lung, latent    Past Surgical History:  Procedure Laterality Date  . COLONOSCOPY  2013?  Marland Kitchen PERICARDIOCENTESIS    . UPPER GASTROINTESTINAL ENDOSCOPY  2013   hiatal hernia   Social History   Social History  . Marital status: Married    Spouse name: N/A  . Number of children: N/A  . Years of education: N/A   Social History Main Topics  . Smoking status: Never Smoker  . Smokeless tobacco: Never Used  . Alcohol use No  . Drug use: No  . Sexual activity: Not Asked   Other Topics Concern  . None   Social History Narrative   Married, originally from [place]. In the [place] since 2006 or 2007. One son to daughters. No caffeine no alcohol no tobacco. No drug use.   03/04/2016      Family history is  negative for GI problems. If her heart disease. Review of Systems As per history of present illness. He's been having some back pain. That is somewhat better. His blood pressure is been elevated in the setting of all this pain. He was recently started on lisinopril.  Objective:   Physical Exam @BP  100/80   Pulse 68   Ht 5\' 11"  (1.803 m)   Wt 214 lb 3.2 oz (97.2 kg)   BMI 29.87 kg/m @  General:  Well-developed, well-nourished and in no acute distress Eyes:  anicteric. ENT:   Mouth and posterior pharynx free of lesions.  Neck:   supple w/o  thyromegaly or mass.  Lungs: Clear to auscultation bilaterally. Heart:  S1S2, no rubs, murmurs, gallops. Abdomen:  soft, non-tender, no hepatosplenomegaly, hernia, or mass and BS+.  Rectal: Lymph:  no cervical or supraclavicular adenopathy. Extremities:   no edema, cyanosis or clubbing Skin   no rash. Neuro:  A&O x 3.  Psych:  appropriate mood and  Affect.   Data Reviewed: Wt Readings from Last 3 Encounters:  03/04/16 214 lb 3.2 oz (97.2 kg)  02/24/16 217 lb 12.8 oz (98.8 kg)  02/17/16 217 lb 3.2 oz (98.5 kg)   I reviewed primary care notes, labs in the EMR from 2017 in 2018 as well as emergency room visit chest x-ray. Also reviewed care everywhere with some information related to previous pericardial effusion and those things mentioned in history of present illness.

## 2016-03-23 ENCOUNTER — Ambulatory Visit: Payer: BLUE CROSS/BLUE SHIELD | Admitting: Family Medicine

## 2016-03-25 ENCOUNTER — Ambulatory Visit (INDEPENDENT_AMBULATORY_CARE_PROVIDER_SITE_OTHER): Payer: BLUE CROSS/BLUE SHIELD | Admitting: Family Medicine

## 2016-03-25 VITALS — BP 131/88 | HR 72 | Temp 98.2°F | Ht 71.0 in | Wt 211.0 lb

## 2016-03-25 DIAGNOSIS — I10 Essential (primary) hypertension: Secondary | ICD-10-CM | POA: Insufficient documentation

## 2016-03-25 MED ORDER — LISINOPRIL 20 MG PO TABS: 20.0000 mg | ORAL_TABLET | Freq: Every day | ORAL | 1 refills | Status: DC

## 2016-03-25 NOTE — Progress Notes (Signed)
Pre visit review using our clinic review tool, if applicable. No additional management support is needed unless otherwise documented below in the visit note. 

## 2016-03-25 NOTE — Progress Notes (Signed)
Chief Complaint  Patient presents with  . Follow-up    pt. here today for hypertension f/u; no complaints    Subjective Trevor Lewis is a 36 y.o. male who presents for hypertension follow up. He does monitor home blood pressures. Blood pressures ranging from 120-130's/70-80's on average. He is compliant with medications- Lisinopril 20 mg daily. Patient has these side effects of medication: none He is adhering to a healthy diet overall. Current exercise: walking, lifting weights; typically around 2-3 days per week   Past Medical History:  Diagnosis Date  . Carpal tunnel syndrome 02/17/2016  . GERD (gastroesophageal reflux disease) 02/17/2016  . Hypertension   . Pericardial effusion   . TB lung, latent    Family History  Problem Relation Age of Onset  . Colon cancer Neg Hx   . Esophageal cancer Neg Hx      Medications Current Outpatient Prescriptions on File Prior to Visit  Medication Sig Dispense Refill  . lisinopril (PRINIVIL,ZESTRIL) 20 MG tablet Take 1 tablet (20 mg total) by mouth daily. 90 tablet 3  . pantoprazole (PROTONIX) 40 MG tablet Take 1 tablet (40 mg total) by mouth daily before breakfast. 30 tablet 3   Allergies No Known Allergies  Review of Systems Cardiovascular: no chest pain Respiratory:  no shortness of breath  Exam BP 131/88 (BP Location: Left Arm, Cuff Size: Large)   Pulse 72   Temp 98.2 F (36.8 C) (Oral)   Ht 5\' 11"  (1.803 m)   Wt 211 lb (95.7 kg)   SpO2 98%   BMI 29.43 kg/m  General:  well developed, well nourished, in no apparent distress Skin:  warm, no pallor or diaphoresis Eyes:  pupils equal and round, sclera anicteric without injection Heart :RRR, no murmurs, no bruits, no LE edema Lungs:  clear to auscultation, no accessory muscle use Psych: well oriented with normal range of affect and appropriate judgment/insight  Essential hypertension - Plan: lisinopril (PRINIVIL,ZESTRIL) 20 MG tablet  Orders as above. Keep dose same.   Counseled on diet and exercise- he has lost 6 lbs and is doing well with this. F/u in 3 mo. The patient voiced understanding and agreement to the plan.  Jilda Rocheicholas Paul [place], DO 03/25/16  3:34 PM

## 2016-03-25 NOTE — Patient Instructions (Signed)
Keep up the good work

## 2016-04-15 ENCOUNTER — Institutional Professional Consult (permissible substitution): Payer: BLUE CROSS/BLUE SHIELD | Admitting: Pulmonary Disease

## 2016-05-04 ENCOUNTER — Other Ambulatory Visit: Payer: Self-pay

## 2016-05-04 MED ORDER — PANTOPRAZOLE SODIUM 40 MG PO TBEC
40.0000 mg | DELAYED_RELEASE_TABLET | Freq: Every day | ORAL | 3 refills | Status: DC
Start: 2016-05-04 — End: 2017-09-10

## 2016-06-16 ENCOUNTER — Institutional Professional Consult (permissible substitution): Payer: BLUE CROSS/BLUE SHIELD | Admitting: Pulmonary Disease
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
## 2017-03-04 ENCOUNTER — Telehealth: Payer: Self-pay | Admitting: *Deleted
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
## 2017-03-18 ENCOUNTER — Telehealth: Payer: Self-pay

## 2017-03-18 NOTE — Telephone Encounter (Signed)
[facility]. Form forwarded to [place], Research officer, political partypractice administrator for scanning/emailing.

## 2017-07-15 ENCOUNTER — Emergency Department (HOSPITAL_BASED_OUTPATIENT_CLINIC_OR_DEPARTMENT_OTHER)
Admission: EM | Admit: 2017-07-15 | Discharge: 2017-07-15 | Disposition: A | Discharge status: Home / Self Care | Payer: BLUE CROSS/BLUE SHIELD | Attending: Emergency Medicine | Admitting: Emergency Medicine

## 2017-07-15 ENCOUNTER — Emergency Department (HOSPITAL_BASED_OUTPATIENT_CLINIC_OR_DEPARTMENT_OTHER): Payer: BLUE CROSS/BLUE SHIELD

## 2017-07-15 ENCOUNTER — Encounter (HOSPITAL_BASED_OUTPATIENT_CLINIC_OR_DEPARTMENT_OTHER): Payer: Self-pay

## 2017-07-15 ENCOUNTER — Other Ambulatory Visit: Payer: Self-pay

## 2017-07-15 DIAGNOSIS — R072 Precordial pain: Secondary | ICD-10-CM | POA: Diagnosis not present

## 2017-07-15 DIAGNOSIS — Z79899 Other long term (current) drug therapy: Secondary | ICD-10-CM | POA: Insufficient documentation

## 2017-07-15 DIAGNOSIS — I10 Essential (primary) hypertension: Secondary | ICD-10-CM | POA: Insufficient documentation

## 2017-07-15 LAB — BASIC METABOLIC PANEL
Anion gap: 9 (ref 5–15)
BUN: 8 mg/dL (ref 6–20)
CALCIUM: 8.7 mg/dL — AB (ref 8.9–10.3)
CO2: 27 mmol/L (ref 22–32)
CREATININE: 1.06 mg/dL (ref 0.61–1.24)
Chloride: 104 mmol/L (ref 101–111)
GFR calc Af Amer: >60 mL/min (ref >60)
GLUCOSE: 106 mg/dL — AB (ref 65–99)
Potassium: 4 mmol/L (ref 3.5–5.1)
Sodium: 140 mmol/L (ref 135–145)

## 2017-07-15 LAB — CBC
HCT: 45.2 % (ref 39.0–52.0)
Hemoglobin: 15.3 g/dL (ref 13.0–17.0)
MCH: 30.5 pg (ref 26.0–34.0)
MCHC: 33.8 g/dL (ref 30.0–36.0)
MCV: 90.2 fL (ref 78.0–100.0)
PLATELETS: 336 10*3/uL (ref 150–400)
RBC: 5.01 MIL/uL (ref 4.22–5.81)
RDW: 12 % (ref 11.5–15.5)
WBC: 5.1 10*3/uL (ref 4.0–10.5)

## 2017-07-15 LAB — D-DIMER, QUANTITATIVE (NOT AT ARMC): D DIMER QUANT: 0.42 ug{FEU}/mL (ref 0.00–0.50)

## 2017-07-15 LAB — TROPONIN I

## 2017-07-15 NOTE — ED Notes (Signed)
Pt returned from xray

## 2017-07-15 NOTE — ED Notes (Addendum)
Pt c/o left side chest pain that radiates to his left arm and left neck with generalized body aches and bilateral foot pain.  Pt states he took some aspirin because his blood pressure was elevated, denies having HTN, although has not been seen in over a year.  States he stopped taking the blood pressure medication because he felt he did not need it.

## 2017-07-15 NOTE — ED Provider Notes (Signed)
[facility] EMERGENCY DEPARTMENT Provider Note   CSN: [id number] Arrival date & time: 07/15/17  2022     History   Chief Complaint Chief Complaint  Patient presents with  . Chest Pain    HPI Trevor Lewis is a 37 y.o. male.  HPI Patient is a 37 year old male presents the emergency department with 3 days of left-sided chest pain with radiation to his left arm and neck.  This is not exertional in nature.  Sometimes it occurs at rest.  He also reports possible elevated blood pressure at home.  At one point he was started on medication for blood pressure but he no longer takes any medication for it.  No family history of early cardiac disease.  He does have a history of prior pericardial effusion that per the patient underwent pericardiocentesis.  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  No history of DVT or pulmonary embolism but he does have some mild left leg pain which concerns him for the possibility of DVT.  No family history of venous thromboembolic disease.  No unilateral leg swelling.  No fevers or chills.  No productive cough.  No shortness of breath.  No family history of early cardiac disease.    Past Medical History:  Diagnosis Date  . Carpal tunnel syndrome 02/17/2016  . GERD (gastroesophageal reflux disease) 02/17/2016  . Hypertension   . Pericardial effusion   . TB lung, latent     Patient Active Problem List   Diagnosis Date Noted  . Essential hypertension 03/25/2016  . GERD (gastroesophageal reflux disease) 02/17/2016  . Carpal tunnel syndrome 02/17/2016    Past Surgical History:  Procedure Laterality Date  . COLONOSCOPY  2013?  Marland Kitchen PERICARDIOCENTESIS    . UPPER GASTROINTESTINAL ENDOSCOPY  2013   hiatal hernia        Home Medications    Prior to Admission medications   Medication Sig Start Date End Date Taking? Authorizing Provider  lisinopril (PRINIVIL,ZESTRIL) 20 MG tablet Take 1 tablet (20 mg total) by mouth  daily. 03/25/16   Sharlene Dory, DO  pantoprazole (PROTONIX) 40 MG tablet Take 1 tablet (40 mg total) by mouth daily before breakfast. 05/04/16   Iva Boop, MD    Family History Family History  Problem Relation Age of Onset  . Colon cancer Neg Hx   . Esophageal cancer Neg Hx     Social History Social History   Tobacco Use  . Smoking status: Never Smoker  . Smokeless tobacco: Never Used  Substance Use Topics  . Alcohol use: No  . Drug use: No     Allergies   Patient has no known allergies.   Review of Systems Review of Systems  All other systems reviewed and are negative.    Physical Exam Updated Vital Signs BP (!) 142/96   Pulse (!) 57   Temp 98.9 F (37.2 C) (Oral)   Resp 12   SpO2 97%   Physical Exam  Constitutional: He is oriented to person, place, and time. He appears well-developed and well-nourished.  HENT:  Head: Normocephalic and atraumatic.  Eyes: EOM are normal.  Neck: Normal range of motion.  Cardiovascular: Normal rate, regular rhythm, normal heart sounds and intact distal pulses.  Pulmonary/Chest: Effort normal and breath sounds normal. No respiratory distress.  Abdominal: Soft. He exhibits no distension. There is no tenderness.  Musculoskeletal: Normal range of motion.       Right lower leg: Normal.  He exhibits no tenderness and no edema.       Left lower leg: Normal. He exhibits no tenderness and no edema.  Right foot with mild Candida flexion  Neurological: He is alert and oriented to person, place, and time.  Skin: Skin is warm and dry.  Psychiatric: He has a normal mood and affect. Judgment normal.  Nursing note and vitals reviewed.    ED Treatments / Results  Labs (all labs ordered are listed, but only abnormal results are displayed) Labs Reviewed  BASIC METABOLIC PANEL - Abnormal; Notable for the following components:      Result Value   Glucose, Bld 106 (*)    Calcium 8.7 (*)    All other components within normal  limits  CBC  TROPONIN I  D-DIMER, QUANTITATIVE (NOT AT [facility])    EKG None  Radiology Dg Chest 2 View  Result Date: 07/15/2017 CLINICAL DATA:  Left chest pain EXAM: CHEST - 2 VIEW COMPARISON:  February 10, 2016 FINDINGS: The heart size and mediastinal contours are within normal limits. Both lungs are clear. The visualized skeletal structures are unremarkable. IMPRESSION: No active cardiopulmonary disease. Electronically Signed   By: Sherian ReinWei-Chen  Lin M.D.   On: 07/15/2017 21:26    Procedures US bedside Performed by: Azalia Bilisampos, Novella Abraha, MD Authorized by: Azalia Bilisampos, Handy Mcloud, MD       EMERGENCY DEPARTMENT US CARDIAC EXAM "Study: Limited Ultrasound of the Heart and Pericardium"  INDICATIONS:Chest pain Multiple views of the heart and pericardium were obtained in real-time with a multi-frequency probe.  PERFORMED ZO:XWRUEABY:Myself IMAGES ARCHIVED?: Yes LIMITATIONS:  None VIEWS USED: Subcostal 4 chamber and Parasternal long axis INTERPRETATION: Cardiac activity present, Pericardial effusioin absent and Normal contractility    Medications Ordered in ED Medications - No data to display   Initial Impression / Assessment and Plan / ED Course  I have reviewed the triage vital signs and the nursing notes.  Pertinent labs & imaging results that were available during my care of the patient were reviewed by me and considered in my medical decision making (see chart for details).     No pericardial effusion seen on bedside ultrasound.  D-dimer negative.  Chest x-ray and labs reassuring.  Nonspecific chest pain.  Recommended Lotrimin for what appears to be a candidate infection of his right foot.  Recommended primary care and cardiology follow-up.  Low suspicion for acute coronary syndrome.  Doubt aortic dissection.  No signs to suggest pericardial effusion at this time.  Bedside ultrasound reassuring for absent pericardial effusion.  This was 1 of the family's and patient's main concerns.  Discharge home with  primary care and cardiology follow-up.  Patient and family understand to return the emergency department for new or worsening symptoms  He will need his blood pressure rechecked by his primary care physician.  I do not think he needs to be initiated on blood pressure medicine through the emergency department.  I recommended dietary changes and daily activity as well as weight loss and attempt to better manage his blood pressure with lifestyle modification.  Final Clinical Impressions(s) / ED Diagnoses   Final diagnoses:  Precordial chest pain    ED Discharge Orders    None       Azalia Bilisampos, Najir Roop, MD 07/15/17 2350

## 2017-07-15 NOTE — ED Notes (Signed)
Patient transported to X-ray 

## 2017-07-15 NOTE — ED Notes (Signed)
Pt verbalizes understanding of d/c instructions and denies any further needs at this time. 

## 2017-07-15 NOTE — ED Triage Notes (Signed)
C/o PC x 3 days-NAD-steady gait

## 2017-07-16 ENCOUNTER — Telehealth: Payer: Self-pay

## 2017-07-16 DIAGNOSIS — I10 Essential (primary) hypertension: Secondary | ICD-10-CM

## 2017-07-16 MED ORDER — LISINOPRIL 20 MG PO TABS: 20.0000 mg | ORAL_TABLET | Freq: Every day | ORAL | 1 refills | Status: DC

## 2017-07-16 NOTE — Telephone Encounter (Signed)
Called left message to call back 

## 2017-07-16 NOTE — Telephone Encounter (Signed)
Chartered loss adjusterAuthor received [facility]ifying Thereasa Parkinauthor of pt's discharge from ED 6/20. Author phoned pt. To follow-up and schedule a follow-up visit with Dr. Carmelia RollerWendling.  Pt. States his BP was 170/111 this AM. Pt endorses HA, cramps in arms and legs, but vision remains OK. Author consulted with Dr. Carmelia RollerWendling, and Dr. Carmelia RollerWendling advised Thereasa Parkinauthor to make an appointment with pt in the next two business days after having lisinopril on board, as pt. Reportedly has "run out" of his lisinopril. While relaying this message, pt. Proceeded to call 911 on another phone, asking for ambulance. Author attempted to call pt. Back to make an appointment, and see what was going on, but pt. was still talking to 911, with voices of family in the background. Pt. Stated, "I'm going to the hospital, I'm feeling worse". Findings relayed to Dr. Carmelia RollerWendling.

## 2017-07-16 NOTE — Telephone Encounter (Signed)
Have him come at 115. TY.

## 2017-07-16 NOTE — Telephone Encounter (Signed)
Copied from CRM [id number]. Topic: Appointment Scheduling - Same Day Appointment >> Jul 16, 2017 10:31 AM Floria RavelingStovall, Shana A wrote: Patient called to schedule an appointment for TODAY with  pt called in and stated that he went to er last night for his bp.  They told him to fu with his pcp as soon as possible.  He stated he woke up this morning and bp is still kinda high 151/100.  He would like to know if he could be worked in today?  Please advise ?  .Marland Kitchen

## 2017-07-16 NOTE — Telephone Encounter (Signed)
[facility] [place].

## 2017-09-10 ENCOUNTER — Ambulatory Visit: Payer: BLUE CROSS/BLUE SHIELD | Admitting: Family Medicine

## 2017-09-10 ENCOUNTER — Encounter: Payer: Self-pay | Admitting: Family Medicine

## 2017-09-10 VITALS — BP 130/86 | HR 75 | Temp 98.5°F | Ht 71.0 in | Wt 220.5 lb

## 2017-09-10 DIAGNOSIS — I10 Essential (primary) hypertension: Secondary | ICD-10-CM | POA: Diagnosis not present

## 2017-09-10 DIAGNOSIS — G5602 Carpal tunnel syndrome, left upper limb: Secondary | ICD-10-CM

## 2017-09-10 MED ORDER — METOPROLOL SUCCINATE ER 25 MG PO TB24: 25.0000 mg | ORAL_TABLET | Freq: Every day | ORAL | 2 refills | Status: DC

## 2017-09-10 MED ORDER — PANTOPRAZOLE SODIUM 40 MG PO TBEC: 40.0000 mg | DELAYED_RELEASE_TABLET | Freq: Every day | ORAL | 3 refills | Status: DC

## 2017-09-10 NOTE — Progress Notes (Signed)
Chief Complaint  Patient presents with  . Follow-up    blood pressure    Subjective Trevor Lewis is a 37 y.o. male who presents for hypertension follow up. He does monitor home blood pressures. Blood pressures ranging from 110-120's/70-80's on average. He is compliant with medication- lisinopril 20 mg/d. He was admitted for chest pain r/o around 6 weeks ago and started on metoprolol 25 mg bid and his bp has gone up since running out. He was tolerating this new med well.  Patient has these side effects of medication: none He is adhering to a healthy diet overall. Current exercise: physically active at work  Several week hx of L hand numbness, worse at night. Sometimes will drop things. Affects mainly fingers, believes it is all of them. No inj or change in activity. No neck pain.    Past Medical History:  Diagnosis Date  . Carpal tunnel syndrome 02/17/2016  . GERD (gastroesophageal reflux disease) 02/17/2016  . Hypertension   . Pericardial effusion   . TB lung, latent     Review of Systems Cardiovascular: no chest pain Respiratory:  no shortness of breath  Exam BP 130/86 (BP Location: Left Arm, Patient Position: Sitting, Cuff Size: Large)   Pulse 75   Temp 98.5 F (36.9 C) (Oral)   Ht 5\' 11"  (1.803 m)   Wt 220 lb 8 oz (100 kg)   SpO2 97%   BMI 30.75 kg/m  General:  well developed, well nourished, in no apparent distress Heart: RRR, no bruits, no LE edema Lungs: clear to auscultation, no accessory muscle use Neuro: +Tinel's and Phalen's for L wrist at carpal tunnel, sensation intact to light touch MSK: Grip strength adequate Psych: well oriented with normal range of affect and appropriate judgment/insight  Essential hypertension - Plan: metoprolol succinate (TOPROL-XL) 25 MG 24 hr tablet  Carpal tunnel syndrome of left wrist  Cont ACEi. Restart BB. Cont to check BP. Counseled on diet and exercise. Start wearing splint at night and during activities where s/s's are  flared.  F/u in 6 weeks. The patient voiced understanding and agreement to the plan.  Jilda Rocheicholas Paul ColumbiaWendling, DO 09/10/17  3:09 PM

## 2017-09-10 NOTE — Progress Notes (Signed)
Pre visit review using our clinic review tool, if applicable. No additional management support is needed unless otherwise documented below in the visit note. 

## 2017-09-10 NOTE — Patient Instructions (Addendum)
Keep checking your blood pressures at home.  Keep diet clean and stay active.   Carpal Tunnel Syndrome Carpal tunnel syndrome is a condition that causes pain in your hand and arm. The carpal tunnel is a narrow area located on the palm side of your wrist. Repeated wrist motion or certain diseases may cause swelling within the tunnel. This swelling pinches the main nerve in the wrist (median nerve). What are the causes? This condition may be caused by:  Repeated wrist motions.  Wrist injuries.  Arthritis.  A cyst or tumor in the carpal tunnel.  Fluid buildup during pregnancy.  Sometimes the cause of this condition is not known. What increases the risk? This condition is more likely to develop in:  People who have jobs that cause them to repeatedly move their wrists in the same motion, such as Health visitorbutchers and cashiers.  Women.  People with certain conditions, such as: ? Diabetes. ? Obesity. ? An underactive thyroid (hypothyroidism). ? Kidney failure.  What are the signs or symptoms? Symptoms of this condition include:  A tingling feeling in your fingers, especially in your thumb, index, and middle fingers.  Tingling or numbness in your hand.  An aching feeling in your entire arm, especially when your wrist and elbow are bent for long periods of time.  Wrist pain that goes up your arm to your shoulder.  Pain that goes down into your palm or fingers.  A weak feeling in your hands. You may have trouble grabbing and holding items.  Your symptoms may feel worse during the night. How is this diagnosed? This condition is diagnosed with a medical history and physical exam. You may also have tests, including:  An electromyogram (EMG). This test measures electrical signals sent by your nerves into the muscles.  X-rays.  How is this treated? Treatment for this condition includes:  Lifestyle changes. It is important to stop doing or modify the activity that caused your  condition.  Physical or occupational therapy.  Medicines for pain and inflammation. This may include medicine that is injected into your wrist.  A wrist splint.  Surgery.  Follow these instructions at home: If you have a splint:  Wear it as told by your health care provider. Remove it only as told by your health care provider.  Loosen the splint if your fingers become numb and tingle, or if they turn cold and blue.  Keep the splint clean and dry. General instructions  Take over-the-counter and prescription medicines only as told by your health care provider.  Rest your wrist from any activity that may be causing your pain. If your condition is work related, talk to your employer about changes that can be made, such as getting a wrist pad to use while typing.  If directed, apply ice to the painful area: ? Put ice in a plastic bag. ? Place a towel between your skin and the bag. ? Leave the ice on for 20 minutes, 2-3 times per day.  Keep all follow-up visits as told by your health care provider. This is important.  Do any exercises as told by your health care provider, physical therapist, or occupational therapist. Contact a health care provider if:  You have new symptoms.  Your pain is not controlled with medicines.  Your symptoms get worse. This information is not intended to replace advice given to you by your health care provider. Make sure you discuss any questions you have with your health care provider. Document Released: 01/10/2000 Document  Revised: 05/23/2015 Document Reviewed: 05/30/2014 Elsevier Interactive Patient Education  [employer].

## 2017-09-16 ENCOUNTER — Telehealth: Payer: Self-pay

## 2017-09-16 NOTE — Telephone Encounter (Signed)
[facility] follow up call made to patient. Patient states his BP is elevated 151/118 today after taking BP medications. Called patient to check symptoms and to see if he wanted to try and be seen today. Left message on answering machine for return call.

## 2017-09-16 NOTE — Telephone Encounter (Signed)
Pt returning your call.  Pt aware you will call back ° °

## 2017-09-17 ENCOUNTER — Ambulatory Visit (INDEPENDENT_AMBULATORY_CARE_PROVIDER_SITE_OTHER): Payer: BLUE CROSS/BLUE SHIELD | Admitting: Family Medicine

## 2017-09-17 ENCOUNTER — Encounter: Payer: Self-pay | Admitting: Family Medicine

## 2017-09-17 ENCOUNTER — Ambulatory Visit: Payer: BLUE CROSS/BLUE SHIELD

## 2017-09-17 VITALS — BP 122/88 | HR 64 | Temp 98.4°F | Ht 71.0 in | Wt 219.0 lb

## 2017-09-17 DIAGNOSIS — I10 Essential (primary) hypertension: Secondary | ICD-10-CM

## 2017-09-17 MED ORDER — AMLODIPINE BESYLATE-VALSARTAN 5-160 MG PO TABS
1.0000 | ORAL_TABLET | Freq: Every day | ORAL | 1 refills | Status: DC
Start: 2017-09-17 — End: 2018-12-27

## 2017-09-17 NOTE — Patient Instructions (Addendum)
Bring your blood pressure monitor to your next visit.  Keep checking your blood pressure at home.  Aim to do some physical exertion for 150 minutes per week. This is typically divided into 5 days per week, 30 minutes per day. The activity should be enough to get your heart rate up. Anything is better than nothing if you have time constraints.   DASH Eating Plan DASH stands for "Dietary Approaches to Stop Hypertension." The DASH eating plan is a healthy eating plan that has been shown to reduce high blood pressure (hypertension). It may also reduce your risk for type 2 diabetes, heart disease, and stroke. The DASH eating plan may also help with weight loss. What are tips for following this plan? General guidelines  Avoid eating more than 2,300 mg (milligrams) of salt (sodium) a day. If you have hypertension, you may need to reduce your sodium intake to 1,500 mg a day.  Limit alcohol intake to no more than 1 drink a day for nonpregnant women and 2 drinks a day for men. One drink equals 12 oz of beer, 5 oz of wine, or 1 oz of hard liquor.  Work with your health care provider to maintain a healthy body weight or to lose weight. Ask what an ideal weight is for you.  Get at least 30 minutes of exercise that causes your heart to beat faster (aerobic exercise) most days of the week. Activities may include walking, swimming, or biking.  Work with your health care provider or diet and nutrition specialist (dietitian) to adjust your eating plan to your individual calorie needs. Reading food labels  Check food labels for the amount of sodium per serving. Choose foods with less than 5 percent of the Daily Value of sodium. Generally, foods with less than 300 mg of sodium per serving fit into this eating plan.  To find whole grains, look for the word "whole" as the first word in the ingredient list. Shopping  Buy products labeled as "low-sodium" or "no salt added."  Buy fresh foods. Avoid canned  foods and premade or frozen meals. Cooking  Avoid adding salt when cooking. Use salt-free seasonings or herbs instead of table salt or sea salt. Check with your health care provider or pharmacist before using salt substitutes.  Do not fry foods. Cook foods using healthy methods such as baking, boiling, grilling, and broiling instead.  Cook with heart-healthy oils, such as olive, canola, soybean, or sunflower oil. Meal planning   Eat a balanced diet that includes: ? 5 or more servings of fruits and vegetables each day. At each meal, try to fill half of your plate with fruits and vegetables. ? Up to 6-8 servings of whole grains each day. ? Less than 6 oz of lean meat, poultry, or fish each day. A 3-oz serving of meat is about the same size as a deck of cards. One egg equals 1 oz. ? 2 servings of low-fat dairy each day. ? A serving of nuts, seeds, or beans 5 times each week. ? Heart-healthy fats. Healthy fats called Omega-3 fatty acids are found in foods such as flaxseeds and coldwater fish, like sardines, salmon, and mackerel.  Limit how much you eat of the following: ? Canned or prepackaged foods. ? Food that is high in trans fat, such as fried foods. ? Food that is high in saturated fat, such as fatty meat. ? Sweets, desserts, sugary drinks, and other foods with added sugar. ? Full-fat dairy products.  Do not salt  foods before eating.  Try to eat at least 2 vegetarian meals each week.  Eat more home-cooked food and less restaurant, buffet, and fast food.  When eating at a restaurant, ask that your food be prepared with less salt or no salt, if possible. What foods are recommended? The items listed may not be a complete list. Talk with your dietitian about what dietary choices are best for you. Grains Whole-grain or whole-wheat bread. Whole-grain or whole-wheat pasta. Brown rice. [place]row. Bulgur. Whole-grain and low-sodium cereals. Pita bread. Low-fat, low-sodium crackers.  Whole-wheat flour tortillas. Vegetables Fresh or frozen vegetables (raw, steamed, roasted, or grilled). Low-sodium or reduced-sodium tomato and vegetable juice. Low-sodium or reduced-sodium tomato sauce and tomato paste. Low-sodium or reduced-sodium canned vegetables. Fruits All fresh, dried, or frozen fruit. Canned fruit in natural juice (without added sugar). Meat and other protein foods Skinless chicken or [place]. Ground chicken or [place]. Pork with fat trimmed off. Fish and seafood. Egg whites. Dried beans, peas, or lentils. Unsalted nuts, nut butters, and seeds. Unsalted canned beans. Lean cuts of beef with fat trimmed off. Low-sodium, lean deli meat. Dairy Low-fat (1%) or fat-free (skim) milk. Fat-free, low-fat, or reduced-fat cheeses. Nonfat, low-sodium ricotta or cottage cheese. Low-fat or nonfat yogurt. Low-fat, low-sodium cheese. Fats and oils Soft margarine without trans fats. Vegetable oil. Low-fat, reduced-fat, or light mayonnaise and salad dressings (reduced-sodium). Canola, safflower, olive, soybean, and sunflower oils. Avocado. Seasoning and other foods Herbs. Spices. Seasoning mixes without salt. Unsalted popcorn and pretzels. Fat-free sweets. What foods are not recommended? The items listed may not be a complete list. Talk with your dietitian about what dietary choices are best for you. Grains Baked goods made with fat, such as croissants, muffins, or some breads. Dry pasta or rice meal packs. Vegetables Creamed or fried vegetables. Vegetables in a cheese sauce. Regular canned vegetables (not low-sodium or reduced-sodium). Regular canned tomato sauce and paste (not low-sodium or reduced-sodium). Regular tomato and vegetable juice (not low-sodium or reduced-sodium). Angie Fava. Olives. Fruits Canned fruit in a light or heavy syrup. Fried fruit. Fruit in cream or butter sauce. Meat and other protein foods Fatty cuts of meat. Ribs. Fried meat. Berniece Salines. Sausage. Bologna and other  processed lunch meats. Salami. Fatback. Hotdogs. Bratwurst. Salted nuts and seeds. Canned beans with added salt. Canned or smoked fish. Whole eggs or egg yolks. Chicken or [place] with skin. Dairy Whole or 2% milk, cream, and half-and-half. Whole or full-fat cream cheese. Whole-fat or sweetened yogurt. Full-fat cheese. Nondairy creamers. Whipped toppings. Processed cheese and cheese spreads. Fats and oils Butter. Stick margarine. Lard. Shortening. Ghee. Bacon fat. Tropical oils, such as coconut, palm kernel, or palm oil. Seasoning and other foods Salted popcorn and pretzels. Onion salt, garlic salt, seasoned salt, table salt, and sea salt. Worcestershire sauce. Tartar sauce. Barbecue sauce. Teriyaki sauce. Soy sauce, including reduced-sodium. Steak sauce. Canned and packaged gravies. Fish sauce. Oyster sauce. Cocktail sauce. Horseradish that you find on the shelf. Ketchup. Mustard. Meat flavorings and tenderizers. Bouillon cubes. Hot sauce and Tabasco sauce. Premade or packaged marinades. Premade or packaged taco seasonings. Relishes. Regular salad dressings. Where to find more information:  National Heart, Lung, and [place]: https://wilson-eaton.com/  American Heart Association: www.heart.org Summary  The DASH eating plan is a healthy eating plan that has been shown to reduce high blood pressure (hypertension). It may also reduce your risk for type 2 diabetes, heart disease, and stroke.  With the DASH eating plan, you should limit salt (sodium) intake to 2,300  mg a day. If you have hypertension, you may need to reduce your sodium intake to 1,500 mg a day.  When on the DASH eating plan, aim to eat more fresh fruits and vegetables, whole grains, lean proteins, low-fat dairy, and heart-healthy fats.  Work with your health care provider or diet and nutrition specialist (dietitian) to adjust your eating plan to your individual calorie needs. This information is not intended to replace advice given to  you by your health care provider. Make sure you discuss any questions you have with your health care provider. Document Released: 01/01/2011 Document Revised: 01/06/2016 Document Reviewed: 01/06/2016 Elsevier Interactive Patient Education  [employer].

## 2017-09-17 NOTE — Telephone Encounter (Signed)
Patient coming in today @ 2:15 to see Dr. Carmelia RollerWendling.

## 2017-09-17 NOTE — Progress Notes (Signed)
Pre visit review using our clinic review tool, if applicable. No additional management support is needed unless otherwise documented below in the visit note. 

## 2017-09-17 NOTE — Progress Notes (Signed)
Chief Complaint  Patient presents with  . Hypertension    Subjective Trevor Lewis is a 37 y.o. male who presents for hypertension follow up. He does monitor home blood pressures. Blood pressures ranging from 140-150's/90-100's on average. He is compliant with medications- lisinopril 20 mg/d, metoprolol. Patient has these side effects of medication: none He is adhering to a healthy diet overall. Current exercise: none   Past Medical History:  Diagnosis Date  . Carpal tunnel syndrome 02/17/2016  . GERD (gastroesophageal reflux disease) 02/17/2016  . Hypertension   . Pericardial effusion   . TB lung, latent     Review of Systems Cardiovascular: no chest pain Respiratory:  no shortness of breath  Exam BP 122/88 (BP Location: Left Arm, Patient Position: Sitting, Cuff Size: Large)   Pulse 64   Temp 98.4 F (36.9 C) (Oral)   Ht 5\' 11"  (1.803 m)   Wt 219 lb (99.3 kg)   SpO2 98%   BMI 30.54 kg/m  General:  well developed, well nourished, in no apparent distress Heart: RRR, no bruits, no LE edema Lungs: clear to auscultation, no accessory muscle use Psych: well oriented with normal range of affect and appropriate judgment/insight  Essential hypertension - Plan: amLODipine-valsartan (EXFORGE) 5-160 MG tablet  Orders as above. Stop ACEi as he does not think it was effective, start above. Cont BB. Counseled on diet and exercise. DASH info given. F/u in 1 mo. The patient voiced understanding and agreement to the plan.  Jilda Rocheicholas Paul Rugby[place], DO 09/17/17  4:26 PM

## 2017-09-17 NOTE — Telephone Encounter (Signed)
[facility] was calling to say he got a call at the last minute that he would have to pick up his kids from school and missed his 2:15 appt today. Pt is requesting to still be seen. Per verbal from Zella BallRobin, ok to bring in at 4:15. Pt aware.

## 2017-10-25 ENCOUNTER — Ambulatory Visit: Payer: BLUE CROSS/BLUE SHIELD | Admitting: Family Medicine

## 2017-10-25 DIAGNOSIS — Z0289 Encounter for other administrative examinations: Secondary | ICD-10-CM

## 2017-11-03 ENCOUNTER — Encounter: Payer: BLUE CROSS/BLUE SHIELD | Admitting: Family Medicine

## 2017-11-03 ENCOUNTER — Encounter: Payer: Self-pay | Admitting: Family Medicine

## 2017-11-03 DIAGNOSIS — Z0289 Encounter for other administrative examinations: Secondary | ICD-10-CM

## 2017-11-04 ENCOUNTER — Encounter: Payer: Self-pay | Admitting: Family Medicine

## 2017-11-17 ENCOUNTER — Telehealth: Payer: Self-pay | Admitting: Family Medicine

## 2017-11-17 NOTE — Telephone Encounter (Signed)
Called primary number on file to notify patient of 3 No Shows in 2 months.  Notified of No Show Policy and the reserved right to dismiss.  Asked patient to call with any questions.

## 2018-09-23 ENCOUNTER — Other Ambulatory Visit: Payer: Self-pay | Admitting: Family Medicine

## 2018-10-18 ENCOUNTER — Other Ambulatory Visit: Payer: Self-pay | Admitting: Family Medicine

## 2018-12-27 ENCOUNTER — Ambulatory Visit: Payer: Self-pay | Admitting: *Deleted

## 2018-12-27 ENCOUNTER — Ambulatory Visit (INDEPENDENT_AMBULATORY_CARE_PROVIDER_SITE_OTHER): Payer: BLUE CROSS/BLUE SHIELD | Admitting: Family Medicine

## 2018-12-27 ENCOUNTER — Encounter: Payer: Self-pay | Admitting: Family Medicine

## 2018-12-27 ENCOUNTER — Other Ambulatory Visit: Payer: Self-pay

## 2018-12-27 VITALS — BP 144/110 | HR 96 | Temp 98.3°F | Ht 71.0 in | Wt 226.1 lb

## 2018-12-27 DIAGNOSIS — R5383 Other fatigue: Secondary | ICD-10-CM | POA: Insufficient documentation

## 2018-12-27 DIAGNOSIS — H538 Other visual disturbances: Secondary | ICD-10-CM | POA: Diagnosis not present

## 2018-12-27 DIAGNOSIS — Z23 Encounter for immunization: Secondary | ICD-10-CM | POA: Diagnosis not present

## 2018-12-27 DIAGNOSIS — E1165 Type 2 diabetes mellitus with hyperglycemia: Secondary | ICD-10-CM | POA: Diagnosis not present

## 2018-12-27 DIAGNOSIS — I10 Essential (primary) hypertension: Secondary | ICD-10-CM

## 2018-12-27 DIAGNOSIS — R0683 Snoring: Secondary | ICD-10-CM

## 2018-12-27 DIAGNOSIS — R739 Hyperglycemia, unspecified: Secondary | ICD-10-CM | POA: Diagnosis not present

## 2018-12-27 LAB — GLUCOSE, POCT (MANUAL RESULT ENTRY): POC Glucose: 306 mg/dl — AB (ref 70–99)

## 2018-12-27 MED ORDER — METFORMIN HCL 500 MG PO TABS
ORAL_TABLET | ORAL | 1 refills | Status: DC
Start: 2018-12-27 — End: 2019-01-04

## 2018-12-27 MED ORDER — ATORVASTATIN CALCIUM 40 MG PO TABS: 40.0000 mg | ORAL_TABLET | Freq: Every day | ORAL | 3 refills | Status: DC

## 2018-12-27 MED ORDER — VALSARTAN 160 MG PO TABS
160.0000 mg | ORAL_TABLET | Freq: Every day | ORAL | 1 refills | Status: DC
Start: 2018-12-27 — End: 2019-01-18

## 2018-12-27 NOTE — Progress Notes (Signed)
Chief Complaint  Patient presents with  . Hypertension  . Dizziness    Subjective Trevor Lewis is a 38 y.o. male who presents for hypertension follow up. He does monitor home blood pressures. Blood pressures ranging from 150-160's/100's on average. He is not compliant with medications. He is usually adhering to a healthy diet overall. Current exercise: active at work, walking He is not having any chest pain or shortness of breath.  Pt reports checking sugar this morning and it was 304.  He has been losing weight, been very thirsty, and has been urinating more frequently.  He denies any pain or bowel changes.  He does not have a known history of diabetes.  He reports having blurry vision in both eyes. No pain, tearing, redness, injury, chemical exposure.  He has had a high blood pressure in the past but never had issues with vision then.  He does not follow with an eye doctor.  Patient also reports poor sleep.  He does snore and has daytime fatigue.  He was referred to the pulmonology team for obstructive sleep apnea evaluation in the past but did not go to that appointment.  He does not have a sleep specialist at this time and does not report ever having a sleep study done.   Past Medical History:  Diagnosis Date  . Carpal tunnel syndrome 02/17/2016  . GERD (gastroesophageal reflux disease) 02/17/2016  . Hypertension   . Pericardial effusion   . TB lung, latent     Review of Systems Cardiovascular: no chest pain Respiratory:  no shortness of breath Endo: + Weight loss GU: + Polyuria GI: No constipation Eyes: +blurry vision Const: No fevers Neuro: No tingling Skin: No sores Mouth: +dryness Psych: +insomnia  Exam BP (!) 144/110 (BP Location: Left Arm, Patient Position: Sitting, Cuff Size: Large)   Pulse 96   Temp 98.3 F (36.8 C) (Oral)   Ht 5' 11"  (1.803 m)   Wt 226 lb 2 oz (102.6 kg)   SpO2 97%   BMI 31.54 kg/m  General:  well developed, well nourished, in no  apparent distress Heart: RRR, no bruits, no LE edema Lungs: clear to auscultation, no accessory muscle use Psych: well oriented with normal range of affect and appropriate judgment/insight  Essential hypertension - Plan: Comp Met (CMET), valsartan (DIOVAN) 160 MG tablet, EKG 12-Lead  Hyperglycemia - Plan: POCT Glucose (CBG)  Blurred vision  Type 2 diabetes mellitus with hyperglycemia, without long-term current use of insulin (HCC) - Plan: Comp Met (CMET), Lipid Profile, HgB A1c, Urine Microalbumin w/creat. ratio, atorvastatin (LIPITOR) 40 MG tablet, metFORMIN (GLUCOPHAGE) 500 MG tablet, Ambulatory referral to Ophthalmology, POCT Glucose (CBG)  Snoring - Plan: Ambulatory referral to Pulmonology  Fatigue, unspecified type - Plan: Ambulatory referral to Pulmonology  Need for vaccination against Streptococcus pneumoniae - Plan: Pneumococcal polysaccharide vaccine 23-valent greater than or equal to 2yo subcutaneous/IM  Diagnosing diabetes with random blood sugar greater than 200 + symptoms.  Will start metformin 500 mg daily and adding another tab every week.  Counseled on diet and exercise.  Continue to check blood sugars at home.  Will start statin.  Check A1c.  Check microalbumin creatinine ratio.  Pneumovax today.  Suggested flu shot but he declined. Prior to a string of noncompliance, his blood pressure was controlled.  Will readd valsartan for nephro protection.  Did not want to add everything back at once to avoid symptomatic hypotension.  EKG is unremarkable for acute changes or hypertrophic cardiomyopathy. He needs to  see the sleep team.  Likely has untreated sleep apnea. F/u in 2 weeks. The patient voiced understanding and agreement to the plan.  [place], DO 12/27/18  4:36 PM

## 2018-12-27 NOTE — Patient Instructions (Addendum)
Give [place] 2-3 business days to get the results of your labs back.   Check your sugars around 2-3 times per week. Alternate checking in the morning before you eat, in the afternoon and before bed. Write them down and bring it to your next appointment.   Find out which insurance has [place]/us in your network, otherwise I would recommend following up with a [facility] provider due to cost.  If you do not hear anything about your referrals in the next 1-2 weeks, call our office and ask for an update.  Keep checking your blood pressures at home.   Let us know if you need anything.   Healthy Eating Plan Many factors influence your heart health, including eating and exercise habits. Heart (coronary) risk increases with abnormal blood fat (lipid) levels. Heart-healthy meal planning includes limiting unhealthy fats, increasing healthy fats, and making other small dietary changes. This includes maintaining a healthy body weight to help keep lipid levels within a normal range.  WHAT IS MY PLAN?  Your health care provider recommends that you:  Drink a glass of water before meals to help with satiety.  Eat slowly.  An alternative to the water is to add Metamucil. This will help with satiety as well. It does contain calories, unlike water.  WHAT TYPES OF FAT SHOULD I CHOOSE?  Choose healthy fats more often. Choose monounsaturated and polyunsaturated fats, such as olive oil and canola oil, flaxseeds, walnuts, almonds, and seeds.  Eat more omega-3 fats. Good choices include salmon, mackerel, sardines, tuna, flaxseed oil, and ground flaxseeds. Aim to eat fish at least two times each week.  Avoid foods with partially hydrogenated oils in them. These contain trans fats. Examples of foods that contain trans fats are stick margarine, some tub margarines, cookies, crackers, and other baked goods. If you are going to avoid a fat, this is the one to avoid!  WHAT GENERAL GUIDELINES DO I NEED TO  FOLLOW?  Check food labels carefully to identify foods with trans fats. Avoid these types of options when possible.  Fill one half of your plate with vegetables and green salads. Eat 4-5 servings of vegetables per day. A serving of vegetables equals 1 cup of raw leafy vegetables,  cup of raw or cooked cut-up vegetables, or  cup of vegetable juice.  Fill one fourth of your plate with whole grains. Look for the word "whole" as the first word in the ingredient list.  Fill one fourth of your plate with lean protein foods.  Eat 4-5 servings of fruit per day. A serving of fruit equals one medium whole fruit,  cup of dried fruit,  cup of fresh, frozen, or canned fruit. Try to avoid fruits in cups/syrups as the sugar content can be high.  Eat more foods that contain soluble fiber. Examples of foods that contain this type of fiber are apples, broccoli, carrots, beans, peas, and barley. Aim to get 20-30 g of fiber per day.  Eat more home-cooked food and less restaurant, buffet, and fast food.  Limit or avoid alcohol.  Limit foods that are high in starch and sugar.  Avoid fried foods when able.  Cook foods by using methods other than frying. Baking, boiling, grilling, and broiling are all great options. Other fat-reducing suggestions include: ? Removing the skin from poultry. ? Removing all visible fats from meats. ? Skimming the fat off of stews, soups, and gravies before serving them. ? Steaming vegetables in water or broth.  Lose weight  if you are overweight. Losing just 5-10% of your initial body weight can help your overall health and prevent diseases such as diabetes and heart disease.  Increase your consumption of nuts, legumes, and seeds to 4-5 servings per week. One serving of dried beans or legumes equals  cup after being cooked, one serving of nuts equals 1 ounces, and one serving of seeds equals  ounce or 1 tablespoon.  WHAT ARE GOOD FOODS CAN I EAT? Grains Grainy breads  (try to find bread that is 3 g of fiber per slice or greater), oatmeal, light popcorn. Whole-grain cereals. Rice and pasta, including brown rice and those that are made with whole wheat. Edamame pasta is a great alternative to grain pasta. It has a higher protein content. Try to avoid significant consumption of white bread, sugary cereals, or pastries/baked goods.  Vegetables All vegetables. Cooked white potatoes do not count as vegetables.  Fruits All fruits, but limit pineapple and bananas as these fruits have a higher sugar content.  Meats and Other Protein Sources Lean, well-trimmed beef, veal, pork, and lamb. Chicken and [place] without skin. All fish and shellfish. Wild duck, rabbit, pheasant, and venison. Egg whites or low-cholesterol egg substitutes. Dried beans, peas, lentils, and tofu.Seeds and most nuts.  Dairy Low-fat or nonfat cheeses, including ricotta, string, and mozzarella. Skim or 1% milk that is liquid, powdered, or evaporated. Buttermilk that is made with low-fat milk. Nonfat or low-fat yogurt. Soy/Almond milk are good alternatives if you cannot handle dairy.  Beverages Water is the best for you. Sports drinks with less sugar are more desirable unless you are a highly active athlete.  Sweets and Desserts Sherbets and fruit ices. Honey, jam, marmalade, jelly, and syrups. Dark chocolate.  Eat all sweets and desserts in moderation.  Fats and Oils Nonhydrogenated (trans-free) margarines. Vegetable oils, including soybean, sesame, sunflower, olive, peanut, safflower, corn, canola, and cottonseed. Salad dressings or mayonnaise that are made with a vegetable oil. Limit added fats and oils that you use for cooking, baking, salads, and as spreads.  Other Cocoa powder. Coffee and tea. Most condiments.  The items listed above may not be a complete list of recommended foods or beverages. Contact your dietitian for more options.

## 2018-12-27 NOTE — Telephone Encounter (Signed)
BP- 160/111 Glucose- 300 Dizzy, blurred vision  Reason for Disposition . [8] Systolic BP  >= 115 OR Diastolic >= 726 AND [2] cardiac or neurologic symptoms (e.g., chest pain, difficulty breathing, unsteady gait, blurred vision)  Answer Assessment - Initial Assessment Questions 1. BLOOD GLUCOSE: "What is your blood glucose level?"      300- fasting 2. ONSET: "When did you check the blood glucose?"     9 am 3. USUAL RANGE: "What is your glucose level usually?" (e.g., usual fasting morning value, usual evening value)     Does not check 4. KETONES: "Do you check for ketones (urine or blood test strips)?" If yes, ask: "What does the test show now?"      n/a 5. TYPE 1 or 2:  "Do you know what type of diabetes you have?"  (e.g., Type 1, Type 2, Gestational; doesn't know)      Not diagnosed 6. INSULIN: "Do you take insulin?" "What type of insulin(s) do you use? What is the mode of delivery? (syringe, pen; injection or pump)?"      n/a 7. DIABETES PILLS: "Do you take any pills for your diabetes?" If yes, ask: "Have you missed taking any pills recently?"     n/a 8. OTHER SYMPTOMS: "Do you have any symptoms?" (e.g., fever, frequent urination, difficulty breathing, dizziness, weakness, vomiting)     Dizziness 9. PREGNANCY: "Is there any chance you are pregnant?" "When was your last menstrual period?"     n/a  Answer Assessment - Initial Assessment Questions 1. BLOOD PRESSURE: "What is the blood pressure?" "Did you take at least two measurements 5 minutes apart?"     160/111, 191/121 2. ONSET: "When did you take your blood pressure?"     9 am 3. HOW: "How did you obtain the blood pressure?" (e.g., visiting nurse, automatic home BP monitor)     Automatic cuff 4. HISTORY: "Do you have a history of high blood pressure?"     yes 5. MEDICATIONS: "Are you taking any medications for blood pressure?" "Have you missed any doses recently?"     No medication at this time 6. OTHER SYMPTOMS: "Do you have  any symptoms?" (e.g., headache, chest pain, blurred vision, difficulty breathing, weakness)     Blurred vision, dizziness 7. PREGNANCY: "Is there any chance you are pregnant?" "When was your last menstrual period?"     n/a  Protocols used: HIGH BLOOD PRESSURE-A-AH, DIABETES - HIGH BLOOD SUGAR-A-AH

## 2018-12-27 NOTE — Telephone Encounter (Signed)
Scheduled appt today. 

## 2018-12-27 NOTE — Telephone Encounter (Signed)
We can evaluate here and save him a trip to ED if we can catch him. Ty.

## 2018-12-28 LAB — COMPREHENSIVE METABOLIC PANEL
ALT: 46 U/L (ref 0–53)
AST: 28 U/L (ref 0–37)
Albumin: 4.5 g/dL (ref 3.5–5.2)
Alkaline Phosphatase: 103 U/L (ref 39–117)
BUN: 12 mg/dL (ref 6–23)
CO2: 29 mEq/L (ref 19–32)
Calcium: 9.7 mg/dL (ref 8.4–10.5)
Chloride: 99 mEq/L (ref 96–112)
Creatinine, Ser: 1.11 mg/dL (ref 0.40–1.50)
GFR: 88.44 mL/min (ref >60.00)
Glucose, Bld: 313 mg/dL — ABNORMAL HIGH (ref 70–99)
Potassium: 4.1 mEq/L (ref 3.5–5.1)
Sodium: 137 mEq/L (ref 135–145)
Total Bilirubin: 0.7 mg/dL (ref 0.2–1.2)
Total Protein: 7.5 g/dL (ref 6.0–8.3)

## 2018-12-28 LAB — LIPID PANEL
Cholesterol: 200 mg/dL (ref 0–200)
HDL: 30.9 mg/dL — ABNORMAL LOW (ref >39.00)
NonHDL: 168.9
Total CHOL/HDL Ratio: 6
Triglycerides: 368 mg/dL — ABNORMAL HIGH (ref 0.0–149.0)
VLDL: 73.6 mg/dL — ABNORMAL HIGH (ref 0.0–40.0)

## 2018-12-28 LAB — MICROALBUMIN / CREATININE URINE RATIO
Creatinine,U: 102.9 mg/dL
Microalb Creat Ratio: 2.5 mg/g (ref 0.0–30.0)
Microalb, Ur: 2.6 mg/dL — ABNORMAL HIGH (ref 0.0–1.9)

## 2018-12-28 LAB — LDL CHOLESTEROL, DIRECT: Direct LDL: 94 mg/dL

## 2018-12-28 LAB — HEMOGLOBIN A1C: Hgb A1c MFr Bld: 11.7 % — ABNORMAL HIGH (ref 4.6–6.5)

## 2018-12-29 ENCOUNTER — Ambulatory Visit: Payer: Self-pay

## 2018-12-29 NOTE — Telephone Encounter (Signed)
Several attempts to reach Flow Coordinator without success.  Contacted Scheduler, Kristie.  Was advised to forward note to the office, and nurse will review, and contact patient, if necessary.

## 2018-12-29 NOTE — Telephone Encounter (Signed)
Pt. Called to report elevated blood sugar of 369 @ 1:40 PM.  Stated his fasting blood sugar at 9:00 AM was 234.  C/o headache, freq. Urination, and urgency to urinate.  Reported blurred vision with far sight, over past week.  Reported started on Metformin 500 mg daily, after being seen in office on 12/1.  Voiced concern that the medication is not working.   Questioned about what he is eating.  Stated he had a salad only today.  Reported has been drinking only water.  Advised to increase water intake to try and assist with lowering blood sugar.  Also advised that medication will take some time, as he gradually increased dosage, per PCP instructions, in getting blood sugar under control.  Pt. Also reported he has pain in right arm injection site from receiving a pneumonia vaccine.  Advised that localized reaction is common with the vaccine.  Advised he can apply ice pack, and take Tylenol for pain; symptoms should subside in 2-3 days after injection.  Advised will send note to Dr. Nani Ravens to review, and make further recommendations.  Pt. Advised to continue monitoring blood sugar, following recommendations of PCP, and increasing water intake.  Encouraged to call back to office if symptoms worsen.  Pt. Verb. Understanding.      Reason for Disposition . [1] Blood glucose > 300 mg/dL (16.7 mmol/L) AND [2] two or more times in a row  Answer Assessment - Initial Assessment Questions 1. BLOOD GLUCOSE: "What is your blood glucose level?"      234 @ 9:00 AM and 369 @ 1:40 PM 2. ONSET: "When did you check the blood glucose?"     See above  3. USUAL RANGE: "What is your glucose level usually?" (e.g., usual fasting morning value, usual evening value)     Just started monitoring blood sugars; 124 fasting 12/2; 191 at supper 4. KETONES: "Do you check for ketones (urine or blood test strips)?" If yes, ask: "What does the test show now?"      denied 5. TYPE 1 or 2:  "Do you know what type of diabetes you have?"   (e.g., Type 1, Type 2, Gestational; doesn't know)      Type 2 6. INSULIN: "Do you take insulin?" "What type of insulin(s) do you use? What is the mode of delivery? (syringe, pen; injection or pump)?"      no 7. DIABETES PILLS: "Do you take any pills for your diabetes?" If yes, ask: "Have you missed taking any pills recently?"    Metformin 500 mg at 9:00 AM  8. OTHER SYMPTOMS: "Do you have any symptoms?" (e.g., fever, frequent urination, difficulty breathing, dizziness, weakness, vomiting)     Urge to urinate, urinary freq., headache, some blurred vision x past week, concentration, 9. PREGNANCY: "Is there any chance you are pregnant?" "When was your last menstrual period?"     N/a  Protocols used: DIABETES - HIGH BLOOD SUGAR-A-AH

## 2018-12-30 MED ORDER — GLYBURIDE 5 MG PO TABS: 5.0000 mg | ORAL_TABLET | Freq: Every day | ORAL | 0 refills | Status: DC

## 2018-12-30 NOTE — Telephone Encounter (Signed)
I'm going to add a new medication to take with everything else. Let's follow up on 12/9 instead of waiting another week. Ty.

## 2018-12-30 NOTE — Telephone Encounter (Signed)
Notified pt of below and scheduled OV on 01/04/19 at 8:45am.

## 2018-12-30 NOTE — Addendum Note (Signed)
Addended by: Ames Coupe on: 12/30/2018 12:13 PM   Modules accepted: Orders

## 2019-01-03 ENCOUNTER — Other Ambulatory Visit: Payer: Self-pay

## 2019-01-04 ENCOUNTER — Ambulatory Visit (INDEPENDENT_AMBULATORY_CARE_PROVIDER_SITE_OTHER): Payer: BLUE CROSS/BLUE SHIELD | Admitting: Family Medicine

## 2019-01-04 ENCOUNTER — Encounter: Payer: Self-pay | Admitting: Family Medicine

## 2019-01-04 VITALS — BP 120/82 | HR 88 | Temp 97.5°F | Wt 221.1 lb

## 2019-01-04 DIAGNOSIS — I10 Essential (primary) hypertension: Secondary | ICD-10-CM | POA: Diagnosis not present

## 2019-01-04 DIAGNOSIS — K219 Gastro-esophageal reflux disease without esophagitis: Secondary | ICD-10-CM | POA: Diagnosis not present

## 2019-01-04 DIAGNOSIS — E1165 Type 2 diabetes mellitus with hyperglycemia: Secondary | ICD-10-CM | POA: Diagnosis not present

## 2019-01-04 MED ORDER — METFORMIN HCL ER 750 MG PO TB24: 750.0000 mg | ORAL_TABLET | Freq: Every day | ORAL | 2 refills | Status: DC

## 2019-01-04 MED ORDER — PANTOPRAZOLE SODIUM 40 MG PO TBEC: DELAYED_RELEASE_TABLET | ORAL | 1 refills | Status: DC

## 2019-01-04 NOTE — Progress Notes (Signed)
  Chief Complaint  Patient presents with  . Follow-up    diabetes    Subjective: Patient is a 38 y.o. male here for f./u sugars.  Has been checking sugars, running in the mid 100's.  This is down nicely from his last visit where it was running in the low 300s.  He was started on glyburide 5 mg daily after metformin was not particularly helpful initially.  The Metformin has given him stomach upset.  He is walking/jogging in his diet is fair.  He has had a poor appetite due to the Metformin.  Hypertension Patient presents for hypertension follow up. He does monitor home blood pressures. Blood pressures ranging on average from 110-130's/70-80's. He is compliant with medications. Patient has these side effects of medication: none He is adhering to a healthy diet overall. Exercise: walking, jogging  GERD S/s's are flaring recently. Used to be on Protonix, would like refill.  It is giving him dyspepsia and cough.  ROS: Heart: Denies chest pain  Lungs: Denies SOB   Past Medical History:  Diagnosis Date  . Carpal tunnel syndrome 02/17/2016  . GERD (gastroesophageal reflux disease) 02/17/2016  . Hypertension   . Pericardial effusion   . TB lung, latent     Objective: BP 120/82 (BP Location: Left Arm, Patient Position: Sitting, Cuff Size: Large)   Pulse 88   Temp (!) 97.5 F (36.4 C) (Temporal)   Wt 221 lb 2 oz (100.3 kg)   SpO2 93%   BMI 30.84 kg/m  General: Awake, appears stated age HEENT: MMM, EOMi Heart: RRR, no LE edema, no bruits Skin: No lesions Neuro: Sensation intact to pinprick Lungs: CTAB, no rales, wheezes or rhonchi. No accessory muscle use Psych: Age appropriate judgment and insight, normal affect and mood  Assessment and Plan: Type 2 diabetes mellitus with hyperglycemia, without long-term current use of insulin (HCC) - Plan: metFORMIN (GLUCOPHAGE-XR) 750 MG 24 hr tablet  Essential hypertension  Gastroesophageal reflux disease, unspecified whether  esophagitis present - Plan: pantoprazole (PROTONIX) 40 MG tablet  1-change immediate release to extended release Metformin.  Continue glyburide.  Counseled on diet and exercise.  Continue to check sugars at home. 2-continue valsartan at current dose. 3-reflux precautions given.  Restart Protonix. Follow-up in 1 month. The patient voiced understanding and agreement to the plan.  [place], DO 01/04/19  9:28 AM

## 2019-01-04 NOTE — Patient Instructions (Addendum)
Keep the diet clean and stay active.  Send me a MyChart message in the next 1-2 weeks if your cough does not improve and we will get a chest X-ray.  Stay on the blood pressure medication.  Stay on your glyburide, we are changing the metformin to a long acting version so you have less GI side effects.   Let us know if you need anything.

## 2019-01-09 ENCOUNTER — Other Ambulatory Visit: Payer: Self-pay

## 2019-01-09 ENCOUNTER — Ambulatory Visit (INDEPENDENT_AMBULATORY_CARE_PROVIDER_SITE_OTHER): Payer: BLUE CROSS/BLUE SHIELD | Admitting: Family Medicine

## 2019-01-09 ENCOUNTER — Encounter: Payer: Self-pay | Admitting: Family Medicine

## 2019-01-09 ENCOUNTER — Telehealth: Payer: Self-pay

## 2019-01-09 DIAGNOSIS — R05 Cough: Secondary | ICD-10-CM

## 2019-01-09 DIAGNOSIS — R059 Cough, unspecified: Secondary | ICD-10-CM

## 2019-01-09 DIAGNOSIS — R82998 Other abnormal findings in urine: Secondary | ICD-10-CM

## 2019-01-09 NOTE — Telephone Encounter (Signed)
Copied from [place] [id number]. Topic: General - Inquiry >> Jan 09, 2019  8:09 AM Richardo Priest, NT wrote: Reason for CRM: Pt called in stating he feels like the cough has not gotten better. Pt also believes he did have a reaction to shot in office as it has caused a fever throughout the weekend that has decreased and urine discoloration as well. Pt would like to have chest x-ray ordered and see what can be done. Please advise.

## 2019-01-09 NOTE — Telephone Encounter (Signed)
Virtual

## 2019-01-09 NOTE — Progress Notes (Signed)
Chief Complaint  Patient presents with  . Cough    dark urine    Trevor Lewis is 38 y.o. and is here for a cough. Due to COVID-19 pandemic, we are interacting via web portal for an electronic face-to-face visit. I verified patient's ID using 2 identifiers. Patient agreed to proceed with visit via this method. Patient is at home, I am at office. Patient, his spouse and I are present for visit.   Duration: 2 weeks Productive? No Associated symptoms: fever, chest wall pain, dyspnea, wheezing Denies: nasal congestion, rhinorrhea, sore throat, hemoptysis Hx of GERD? Yes ACEi? Yes   Dark urine over past 4-5 days.  No change in dietary intake or fluid intake.  He feels he is dehydrated and urinates a lot less than when his sugars were in the 300s.  He also continues to lose weight.  He denies any new sexual partners, injury, blood in the urine, discharge, increased frequency, pain, or urgency.  He is not circumcised.  ROS:  Resp: +Cough Cardio: No chest pain  Past Medical History:  Diagnosis Date  . Carpal tunnel syndrome 02/17/2016  . GERD (gastroesophageal reflux disease) 02/17/2016  . Hypertension   . Pericardial effusion   . TB lung, latent    Family History  Problem Relation Age of Onset  . Colon cancer Neg Hx   . Esophageal cancer Neg Hx    Allergies as of 01/09/2019   No Known Allergies     Medication List       Accurate as of January 09, 2019  4:43 PM. If you have any questions, ask your nurse or doctor.        atorvastatin 40 MG tablet Commonly known as: LIPITOR Take 1 tablet (40 mg total) by mouth daily.   glyBURIDE 5 MG tablet Commonly known as: DIABETA Take 1 tablet (5 mg total) by mouth daily with breakfast.   metFORMIN 750 MG 24 hr tablet Commonly known as: GLUCOPHAGE-XR Take 1 tablet (750 mg total) by mouth daily with breakfast.   pantoprazole 40 MG tablet Commonly known as: PROTONIX TAKE 1 TABLET BY MOUTH DAILY BEFORE BREAKFAST   valsartan 160 MG  tablet Commonly known as: DIOVAN Take 1 tablet (160 mg total) by mouth daily.      Exam No conversational dyspnea Age appropriate judgment and insight Nml affect and mood  Cough - Plan: DG Chest 2 View  Dark urine - Plan: Comp Met (CMET), Urinalysis, Routine w reflex microscopic  1- COVID test. Ck CXR. 2- Ck UA and renal function. Could be concentrated 2/2 dehydration after polyuria from hyperglycemia.  F/u in 4 weeks if symptoms fail to improve. The patient voiced understanding and agreement to the plan.  [place], DO 01/09/19 4:43 PM

## 2019-01-09 NOTE — Telephone Encounter (Signed)
That's fine. Ty.  

## 2019-01-09 NOTE — Telephone Encounter (Signed)
Called and scheduled a Virtual today at 4:15

## 2019-01-10 ENCOUNTER — Other Ambulatory Visit: Payer: Self-pay

## 2019-01-10 ENCOUNTER — Ambulatory Visit (HOSPITAL_BASED_OUTPATIENT_CLINIC_OR_DEPARTMENT_OTHER)
Admission: RE | Admit: 2019-01-10 | Discharge: 2019-01-10 | Disposition: A | Discharge status: Home / Self Care | Payer: BLUE CROSS/BLUE SHIELD | Source: Ambulatory Visit | Attending: Family Medicine | Admitting: Family Medicine

## 2019-01-10 ENCOUNTER — Other Ambulatory Visit (INDEPENDENT_AMBULATORY_CARE_PROVIDER_SITE_OTHER): Payer: BLUE CROSS/BLUE SHIELD

## 2019-01-10 ENCOUNTER — Ambulatory Visit: Payer: BLUE CROSS/BLUE SHIELD | Admitting: Family Medicine

## 2019-01-10 DIAGNOSIS — R82998 Other abnormal findings in urine: Secondary | ICD-10-CM | POA: Diagnosis not present

## 2019-01-10 DIAGNOSIS — R059 Cough, unspecified: Secondary | ICD-10-CM

## 2019-01-10 DIAGNOSIS — R05 Cough: Secondary | ICD-10-CM | POA: Insufficient documentation

## 2019-01-10 LAB — URINALYSIS, ROUTINE W REFLEX MICROSCOPIC
Bilirubin Urine: NEGATIVE
Ketones, ur: NEGATIVE
Leukocytes,Ua: NEGATIVE
Nitrite: NEGATIVE
Specific Gravity, Urine: <=1.005 — AB (ref 1.000–1.030)
Total Protein, Urine: 30 — AB
Urine Glucose: NEGATIVE
Urobilinogen, UA: 4 — AB (ref 0.0–1.0)
pH: 6 (ref 5.0–8.0)

## 2019-01-11 ENCOUNTER — Other Ambulatory Visit: Payer: Self-pay | Admitting: Family Medicine

## 2019-01-11 ENCOUNTER — Encounter: Payer: Self-pay | Admitting: Family Medicine

## 2019-01-11 DIAGNOSIS — R7401 Elevation of levels of liver transaminase levels: Secondary | ICD-10-CM

## 2019-01-11 LAB — COMPREHENSIVE METABOLIC PANEL
ALT: 39 U/L (ref 0–53)
AST: 53 U/L — ABNORMAL HIGH (ref 0–37)
Albumin: 3.7 g/dL (ref 3.5–5.2)
Alkaline Phosphatase: 77 U/L (ref 39–117)
BUN: 15 mg/dL (ref 6–23)
CO2: 30 mEq/L (ref 19–32)
Calcium: 9 mg/dL (ref 8.4–10.5)
Chloride: 104 mEq/L (ref 96–112)
Creatinine, Ser: 1.14 mg/dL (ref 0.40–1.50)
GFR: 85.74 mL/min (ref >60.00)
Glucose, Bld: 136 mg/dL — ABNORMAL HIGH (ref 70–99)
Potassium: 4 mEq/L (ref 3.5–5.1)
Sodium: 140 mEq/L (ref 135–145)
Total Bilirubin: 2.4 mg/dL — ABNORMAL HIGH (ref 0.2–1.2)
Total Protein: 7 g/dL (ref 6.0–8.3)

## 2019-01-11 MED ORDER — AZITHROMYCIN 250 MG PO TABS: ORAL_TABLET | ORAL | 0 refills | Status: DC

## 2019-01-12 ENCOUNTER — Encounter: Payer: Self-pay | Admitting: Family Medicine

## 2019-01-12 ENCOUNTER — Other Ambulatory Visit: Payer: Self-pay | Admitting: Family Medicine

## 2019-01-12 MED ORDER — ALBUTEROL SULFATE HFA 108 (90 BASE) MCG/ACT IN AERS: 2.0000 | INHALATION_SPRAY | Freq: Four times a day (QID) | RESPIRATORY_TRACT | 1 refills | Status: DC | PRN

## 2019-01-18 ENCOUNTER — Other Ambulatory Visit: Payer: Self-pay | Admitting: Family Medicine

## 2019-01-18 DIAGNOSIS — I10 Essential (primary) hypertension: Secondary | ICD-10-CM

## 2019-01-21 ENCOUNTER — Other Ambulatory Visit: Payer: Self-pay | Admitting: Family Medicine

## 2019-01-26 ENCOUNTER — Other Ambulatory Visit: Payer: Self-pay | Admitting: Family Medicine

## 2019-01-26 DIAGNOSIS — K219 Gastro-esophageal reflux disease without esophagitis: Secondary | ICD-10-CM

## 2019-02-01 ENCOUNTER — Other Ambulatory Visit: Payer: Self-pay

## 2019-02-01 ENCOUNTER — Encounter: Payer: Self-pay | Admitting: Family Medicine

## 2019-02-01 ENCOUNTER — Ambulatory Visit (INDEPENDENT_AMBULATORY_CARE_PROVIDER_SITE_OTHER): Payer: Self-pay | Admitting: Family Medicine

## 2019-02-01 DIAGNOSIS — I10 Essential (primary) hypertension: Secondary | ICD-10-CM

## 2019-02-01 DIAGNOSIS — E1165 Type 2 diabetes mellitus with hyperglycemia: Secondary | ICD-10-CM

## 2019-02-01 MED ORDER — VALSARTAN 160 MG PO TABS: 160.0000 mg | ORAL_TABLET | Freq: Every day | ORAL | 2 refills | Status: DC

## 2019-02-01 MED ORDER — DAPAGLIFLOZIN PROPANEDIOL 10 MG PO TABS: 10.0000 mg | ORAL_TABLET | Freq: Every day | ORAL | 2 refills | Status: DC

## 2019-02-01 NOTE — Progress Notes (Signed)
Chief Complaint  Patient presents with  . Follow-up    Subjective: Patient is a 39 y.o. male here for DM f/u. Due to COVID-19 pandemic, we are interacting via web portal for an electronic face-to-face visit. I verified patient's ID using 2 identifiers. Patient agreed to proceed with visit via this method. Patient is at home, I am at office. Patient and I are present for visit.   F/u DM. Sugars running in low 100's. Taking metformin XR 750 mg/d and glyburide 5 mg/d. No AE's. Diet is healthy. He is walking on his treadmill. Having some lows if he doesn't eat.  Hypertension Patient presents for hypertension follow up. He does monitor home blood pressures. Blood pressures ranging on average from 120's/70's. He is compliant with medication- valsartan 160 mg/d. Patient has these side effects of medication: none Diet/exercise as above.  ROS: Heart: Denies chest pain  Lungs: Denies SOB   Past Medical History:  Diagnosis Date  . Carpal tunnel syndrome 02/17/2016  . GERD (gastroesophageal reflux disease) 02/17/2016  . Hypertension   . Pericardial effusion   . TB lung, latent     Objective: No conversational dyspnea Age appropriate judgment and insight Nml affect and mood  Assessment and Plan: Type 2 diabetes mellitus with hyperglycemia, without long-term current use of insulin (HCC) - Plan: dapagliflozin propanediol (FARXIGA) 10 MG TABS tablet  Essential hypertension - Plan: valsartan (DIOVAN) 160 MG tablet  1- Stop glyburide given hypoglycemia. Cont Metformin. Replace former w Marcelline Deist. Let me know if there are cost issues. Counseled on diet and exercise. 2- Cont ARB.  F/u in 6 weeks.  The patient voiced understanding and agreement to the plan.  Jilda Roche [place], DO 02/01/19  9:14 AM

## 2019-02-08 ENCOUNTER — Other Ambulatory Visit: Payer: BLUE CROSS/BLUE SHIELD

## 2019-02-13 ENCOUNTER — Other Ambulatory Visit: Payer: Self-pay | Admitting: Family Medicine

## 2019-02-13 DIAGNOSIS — K219 Gastro-esophageal reflux disease without esophagitis: Secondary | ICD-10-CM

## 2019-02-24 ENCOUNTER — Other Ambulatory Visit: Payer: Self-pay | Admitting: Family Medicine

## 2019-03-11 ENCOUNTER — Other Ambulatory Visit: Payer: Self-pay | Admitting: Family Medicine

## 2019-04-04 ENCOUNTER — Other Ambulatory Visit: Payer: Self-pay | Admitting: Family Medicine

## 2019-04-04 DIAGNOSIS — E1165 Type 2 diabetes mellitus with hyperglycemia: Secondary | ICD-10-CM

## 2019-06-15 ENCOUNTER — Ambulatory Visit (INDEPENDENT_AMBULATORY_CARE_PROVIDER_SITE_OTHER): Payer: 59 | Admitting: Internal Medicine

## 2019-06-15 ENCOUNTER — Encounter: Payer: Self-pay | Admitting: Internal Medicine

## 2019-06-15 ENCOUNTER — Other Ambulatory Visit: Payer: Self-pay

## 2019-06-15 VITALS — BP 159/104 | HR 79 | Temp 97.4°F | Resp 18 | Ht 71.0 in | Wt 231.2 lb

## 2019-06-15 DIAGNOSIS — I10 Essential (primary) hypertension: Secondary | ICD-10-CM

## 2019-06-15 DIAGNOSIS — R202 Paresthesia of skin: Secondary | ICD-10-CM

## 2019-06-15 DIAGNOSIS — M79602 Pain in left arm: Secondary | ICD-10-CM

## 2019-06-15 LAB — BASIC METABOLIC PANEL
BUN: 14 mg/dL (ref 6–23)
CO2: 32 mEq/L (ref 19–32)
Calcium: 9.5 mg/dL (ref 8.4–10.5)
Chloride: 100 mEq/L (ref 96–112)
Creatinine, Ser: 0.99 mg/dL (ref 0.40–1.50)
GFR: 100.69 mL/min (ref >60.00)
Glucose, Bld: 112 mg/dL — ABNORMAL HIGH (ref 70–99)
Potassium: 4 mEq/L (ref 3.5–5.1)
Sodium: 136 mEq/L (ref 135–145)

## 2019-06-15 MED ORDER — MELOXICAM 7.5 MG PO TABS: 7.5000 mg | ORAL_TABLET | Freq: Every day | ORAL | 0 refills | Status: DC | PRN

## 2019-06-15 NOTE — Progress Notes (Signed)
Pre visit review using our clinic review tool, if applicable. No additional management support is needed unless otherwise documented below in the visit note. 

## 2019-06-15 NOTE — Patient Instructions (Addendum)
COVID-19 Vaccine Information can be found at: [email or web address] For questions related to vaccine distribution or appointments, please email [email or web address] or call [phone].    Continue checking your blood pressure BP GOAL is between 110/65 and  135/85. If it is consistently higher or lower, let me know     GO TO THE LAB : Get the blood work     GO TO THE FRONT DESK, PLEASE SCHEDULE YOUR APPOINTMENTS You need to see Dr. Carmelia Roller within the next 3 weeks

## 2019-06-15 NOTE — Progress Notes (Signed)
Subjective:    Patient ID: Trevor Lewis, male    DOB: July 21, 1980, 39 y.o.   MRN: [id number]  DOS:  06/15/2019 Type of visit - description: Acute His main concern is left arm pain. Pain is located at the inner aspect of the left arm from the shoulder to the wrist, on and off, worse with certain shoulder movements. Also has a constant tingling like a burning sensation at the tips of all fingers. Symptoms started approximately 4 days ago.   BP Readings from Last 3 Encounters:  06/15/19 (!) 159/104  01/04/19 120/82  12/27/18 (!) 144/110     Review of Systems No injury or fall No neck pain Occasional elbow pain but no shoulder pain No lower extremity symptoms such as weakness, paresthesias. No bladder or bowel incontinence No chest pain or difficulty breathing  Past Medical History:  Diagnosis Date  . Carpal tunnel syndrome 02/17/2016  . GERD (gastroesophageal reflux disease) 02/17/2016  . Hypertension   . Pericardial effusion   . TB lung, latent     Past Surgical History:  Procedure Laterality Date  . COLONOSCOPY  2013?  Marland Kitchen PERICARDIOCENTESIS    . UPPER GASTROINTESTINAL ENDOSCOPY  2013   hiatal hernia    Allergies as of 06/15/2019   No Known Allergies     Medication List       Accurate as of Jun 15, 2019 10:03 AM. If you have any questions, ask your nurse or doctor.        albuterol 108 (90 Base) MCG/ACT inhaler Commonly known as: VENTOLIN HFA Inhale 2 puffs into the lungs every 6 (six) hours as needed for wheezing or shortness of breath.   atorvastatin 40 MG tablet Commonly known as: LIPITOR Take 1 tablet (40 mg total) by mouth daily.   dapagliflozin propanediol 10 MG Tabs tablet Commonly known as: FARXIGA Take 10 mg by mouth daily before breakfast.   metFORMIN 500 MG tablet Commonly known as: GLUCOPHAGE WEEK 1: 1 TAB DAILY. WEEK 2: 1 TAB TWICE DAILY. WEEK 3: 2 TABS IN AM, 1 IN EVENING. WEEK 4: 2 TABS TWICE DAILY   metFORMIN 750 MG 24 hr  tablet Commonly known as: GLUCOPHAGE-XR TAKE 1 TABLET BY MOUTH EVERY DAY WITH BREAKFAST   pantoprazole 40 MG tablet Commonly known as: PROTONIX TAKE 1 TABLET BY MOUTH EVERY DAY BEFORE BREAKFAST   valsartan 160 MG tablet Commonly known as: DIOVAN Take 1 tablet (160 mg total) by mouth daily.          Objective:   Physical Exam BP (!) 159/104 (BP Location: Left Arm, Patient Position: Sitting, Cuff Size: Normal)   Pulse 79   Temp (!) 97.4 F (36.3 C) (Temporal)   Resp 18   Ht 5\' 11"  (1.803 m)   Wt 231 lb 4 oz (104.9 kg)   SpO2 99%   BMI 32.25 kg/m  General:   Well developed, NAD, BMI noted. HEENT:  Normocephalic . Face symmetric, atraumatic Neck: No TTP at the cervical spine, range of motion normal without pain. MSK: Elbows and wrists normal to inspection and palpation  skin: Not pale. Not jaundice Neurologic:  alert & oriented X3.  Speech normal, gait appropriate for age and unassisted Motor symmetric, DTR symmetric.  Pinprick examination upper extremities normal Psych--  Cognition and judgment appear intact.  Cooperative with normal attention span and concentration.  Behavior appropriate. No anxious or depressed appearing.      Assessment     39 year old gentleman, PMH includes HTN, GERD, diabetes,  carpal tunnel syndrome, presents with  Pain, left arm, paresthesias left hand: Symptoms started approximately 4 days ago, no injury, no lower extremity symptoms, picture not completely clear: Radiculopathy?, nerve entrapment?,  Neuropathy?. Since the symptoms started recently, will give round of NSAIDs and see how he responds.  We will check a BMP before we do that. Addendum: BP okay, prescription for meloxicam sent with GI precautions.  See lab results Consider Ortho referral. HTN: good med compliance, BP today elevated, at home last night 128/74, no change DM: Poorly controlled, has not seen PCP months, strongly encouraged to set up a visit.  This visit occurred  during the SARS-CoV-2 public health emergency.  Safety protocols were in place, including screening questions prior to the visit, additional usage of staff PPE, and extensive cleaning of exam room while observing appropriate contact time as indicated for disinfecting solutions.

## 2019-07-10 ENCOUNTER — Encounter: Payer: Self-pay | Admitting: Family Medicine

## 2019-07-10 ENCOUNTER — Other Ambulatory Visit: Payer: Self-pay

## 2019-07-10 ENCOUNTER — Ambulatory Visit: Payer: 59 | Admitting: Family Medicine

## 2019-07-10 VITALS — BP 128/72 | HR 93 | Temp 97.0°F | Ht 71.0 in | Wt 232.4 lb

## 2019-07-10 DIAGNOSIS — I10 Essential (primary) hypertension: Secondary | ICD-10-CM | POA: Diagnosis not present

## 2019-07-10 DIAGNOSIS — G5602 Carpal tunnel syndrome, left upper limb: Secondary | ICD-10-CM | POA: Diagnosis not present

## 2019-07-10 DIAGNOSIS — E1165 Type 2 diabetes mellitus with hyperglycemia: Secondary | ICD-10-CM

## 2019-07-10 DIAGNOSIS — R7401 Elevation of levels of liver transaminase levels: Secondary | ICD-10-CM | POA: Diagnosis not present

## 2019-07-10 LAB — HEMOGLOBIN A1C: Hgb A1c MFr Bld: 6.2 % (ref 4.6–6.5)

## 2019-07-10 LAB — HEPATIC FUNCTION PANEL
ALT: 21 U/L (ref 0–53)
AST: 19 U/L (ref 0–37)
Albumin: 4.3 g/dL (ref 3.5–5.2)
Alkaline Phosphatase: 78 U/L (ref 39–117)
Bilirubin, Direct: 0.1 mg/dL (ref 0.0–0.3)
Total Bilirubin: 0.6 mg/dL (ref 0.2–1.2)
Total Protein: 7.2 g/dL (ref 6.0–8.3)

## 2019-07-10 MED ORDER — METFORMIN HCL 500 MG PO TABS: 500.0000 mg | ORAL_TABLET | Freq: Two times a day (BID) | ORAL | 2 refills | Status: DC

## 2019-07-10 NOTE — Patient Instructions (Signed)
Give [place] 2-3 business days to get the results of your labs back.   Keep the diet clean and stay active.  Because your blood pressure is well-controlled, you no longer have to check your blood pressure at home anymore unless you wish. Some people check it twice daily every day and some people stop altogether. Either or anything in between is fine. Strong work!  Wear the splint at night and when you are doing things that flare your symptoms.  Our follow up will be based on the results of your labs.  Let us know if you need anything.

## 2019-07-10 NOTE — Progress Notes (Signed)
Subjective:   Chief Complaint  Patient presents with  . Follow-up    Trevor Lewis is a 39 y.o. male here for follow-up of diabetes.   Trevor Lewis's self monitored glucose range is 100-mid 100's.  Patient denies freq hypoglycemic reactions. He checks his glucose levels 1 time per day. Patient does not require insulin.   Medications include: metformin 500 mg bid Exercise: walking, active at work  Hypertension Patient presents for hypertension follow up. He does monitor home blood pressures. Blood pressures ranging on average from 120's/70's. He is compliant with medications- valsartan 160 mg/d. Patient has these side effects of medication: none Diet/exercise as above.   Several weeks of numbness in L thumb. Getting better, worse at night. Not using splints.   Past Medical History:  Diagnosis Date  . Carpal tunnel syndrome 02/17/2016  . GERD (gastroesophageal reflux disease) 02/17/2016  . Hypertension   . Pericardial effusion   . TB lung, latent      Related testing: Date of retinal exam: Due Pneumovax: done . Objective:  BP 128/72 (BP Location: Left Arm, Patient Position: Sitting, Cuff Size: Large)   Pulse 93   Temp (!) 97 F (36.1 C) (Temporal)   Ht 5\' 11"  (1.803 m)   Wt 232 lb 6 oz (105.4 kg)   SpO2 97%   BMI 32.41 kg/m  General:  Well developed, well nourished, in no apparent distress Skin:  Warm, no pallor or diaphoresis Head:  Normocephalic, atraumatic Eyes:  Pupils equal and round, sclera anicteric without injection  Lungs:  CTAB, no access msc use Cardio:  RRR, no bruits, no LE edema Neuro: +Tinel's and Phalen's on L wrist Msk: +TTP Psych: Age appropriate judgment and insight  Assessment:   Type 2 diabetes mellitus with hyperglycemia, without long-term current use of insulin (HCC) - Plan: Hemoglobin A1c  Essential hypertension  Carpal tunnel syndrome of left wrist  Elevated AST (SGOT) - Plan: Hepatic function panel   Plan:   1- Much better than  when he first re-established with . Counseled on diet and exercise. 2- Controlled. OK to stop checking BP at home. 3- Wrist brace given today.  F/u in 3-6 mo pending the above. The patient voiced understanding and agreement to the plan.  [place] Cros[place], DO 07/10/19 12:57 PM

## 2019-07-14 ENCOUNTER — Encounter: Payer: Self-pay | Admitting: Internal Medicine

## 2019-07-14 ENCOUNTER — Other Ambulatory Visit: Payer: Self-pay

## 2019-07-14 ENCOUNTER — Ambulatory Visit (INDEPENDENT_AMBULATORY_CARE_PROVIDER_SITE_OTHER): Payer: 59 | Admitting: Internal Medicine

## 2019-07-14 VITALS — BP 144/92 | HR 80 | Temp 98.2°F | Ht 71.0 in | Wt 230.2 lb

## 2019-07-14 DIAGNOSIS — R0683 Snoring: Secondary | ICD-10-CM | POA: Diagnosis not present

## 2019-07-14 DIAGNOSIS — G4733 Obstructive sleep apnea (adult) (pediatric): Secondary | ICD-10-CM

## 2019-07-14 DIAGNOSIS — F5101 Primary insomnia: Secondary | ICD-10-CM

## 2019-07-14 DIAGNOSIS — G4700 Insomnia, unspecified: Secondary | ICD-10-CM | POA: Insufficient documentation

## 2019-07-14 NOTE — Assessment & Plan Note (Signed)
History and exam suggest OSA. Basic issues covered, including responsibility to drive safely.  Plan- sleep study

## 2019-07-14 NOTE — Assessment & Plan Note (Signed)
In last few years difficulty initiating sleep and waking after 3 hours, unable to return to sleep. We will spend more time exploring this, including potential stressors. Need to clarify and manage sleep disordered breathing if significant on sleep study, before we consider a sedative hypnotic or CBT.

## 2019-07-14 NOTE — Patient Instructions (Signed)
Order- schedule Home Sleep Test   Dx OSA  Please call us about 2 weeks after your sleep test, to see if results and recommendations are ready yet. If appropriate, we may be able to start treatment before we see you next.

## 2019-07-14 NOTE — Progress Notes (Signed)
07/14/19- 72 yoM never smoker from [place],  for sleep evaluation, courtesy of Dr Nani Ravens. Medical problem list includes DM2, HTN, Epworth score 11  Body weight today 230 lbs He reports difficulty initiating and maintaining sleep especially in recent years. Admits some daytime tiredness. Occasional coffee, no sleep meds. No ENT surgery or heart/ lung or seizure problems. Wife tells him of loud snoring and witnessed apneas. He doesn't indicate if his [place] residency status might be under current threat and if those issues might be impacting sleep.  Prior to Admission medications   Medication Sig Start Date End Date Taking? Authorizing Provider  albuterol (VENTOLIN HFA) 108 (90 Base) MCG/ACT inhaler Inhale 2 puffs into the lungs every 6 (six) hours as needed for wheezing or shortness of breath. 01/12/19  Yes Shelda Pal, DO  atorvastatin (LIPITOR) 40 MG tablet Take 1 tablet (40 mg total) by mouth daily. 12/27/18  Yes Shelda Pal, DO  meloxicam (MOBIC) 7.5 MG tablet Take 1-2 tablets (7.5-15 mg total) by mouth daily as needed for pain. 06/15/19  Yes Colon Branch, MD  metFORMIN (GLUCOPHAGE) 500 MG tablet Take 1 tablet (500 mg total) by mouth 2 (two) times daily with a meal. 07/10/19  Yes Wendling, Crosby Oyster, DO  pantoprazole (PROTONIX) 40 MG tablet TAKE 1 TABLET BY MOUTH EVERY DAY BEFORE BREAKFAST 02/13/19  Yes Shelda Pal, DO  valsartan (DIOVAN) 160 MG tablet Take 1 tablet (160 mg total) by mouth daily. 02/01/19  Yes Shelda Pal, DO   Past Medical History:  Diagnosis Date  . Carpal tunnel syndrome 02/17/2016  . GERD (gastroesophageal reflux disease) 02/17/2016  . Hypertension   . Pericardial effusion   . TB lung, latent    Past Surgical History:  Procedure Laterality Date  . COLONOSCOPY  2013?  Marland Kitchen PERICARDIOCENTESIS    . UPPER GASTROINTESTINAL ENDOSCOPY  2013   hiatal hernia   Family History  Problem Relation Age of Onset  . Colon cancer Neg Hx   .  Esophageal cancer Neg Hx    Social History   Socioeconomic History  . Marital status: Married    Spouse name: Not on file  . Number of children: Not on file  . Years of education: Not on file  . Highest education level: Not on file  Occupational History  . Not on file  Tobacco Use  . Smoking status: Never Smoker  . Smokeless tobacco: Never Used  Substance and Sexual Activity  . Alcohol use: No  . Drug use: No  . Sexual activity: Not on file  Other Topics Concern  . Not on file  Social History Narrative   Married, originally from [place]. In the [place] since 2006 or 2007. One son to daughters. No caffeine no alcohol no tobacco. No drug use.   03/04/2016   Social Determinants of Health   Financial Resource Strain:   . Difficulty of Paying Living Expenses:   Food Insecurity:   . Worried About Charity fundraiser in the Last Year:   . Arboriculturist in the Last Year:   Transportation Needs:   . Film/video editor (Medical):   Marland Kitchen Lack of Transportation (Non-Medical):   Physical Activity:   . Days of Exercise per Week:   . Minutes of Exercise per Session:   Stress:   . Feeling of Stress :   Social Connections:   . Frequency of Communication with Friends and Family:   . Frequency of Social Gatherings with Friends  and Family:   . Attends Religious Services:   . Active Member of Clubs or Organizations:   . Attends Banker Meetings:   Marland Kitchen Marital Status:   Intimate Partner Violence:   . Fear of Current or Ex-Partner:   . Emotionally Abused:   Marland Kitchen Physically Abused:   . Sexually Abused:    ROS-see HPI   + = positive Constitutional:    weight loss, night sweats, fevers, chills, fatigue, lassitude. HEENT:    headaches, difficulty swallowing, tooth/dental problems, sore throat,       sneezing, itching, ear ache, nasal congestion, post nasal drip, snoring CV:    chest pain, orthopnea, PND, swelling in lower extremities, anasarca,                                   dizziness, palpitations Resp:   shortness of breath with exertion or at rest.                productive cough,   non-productive cough, coughing up of blood.              change in color of mucus.  wheezing.   Skin:    rash or lesions. GI:  No-   heartburn, indigestion, abdominal pain, nausea, vomiting, diarrhea,                 change in bowel habits, loss of appetite GU: dysuria, change in color of urine, no urgency or frequency.   flank pain. MS:   joint pain, stiffness, decreased range of motion, back pain. Neuro-     nothing unusual Psych:  change in mood or affect.  depression or anxiety.   memory loss.  OBJ- Physical Exam General- Alert, Oriented, Affect-appropriate, Distress- none acute, + muscular, not obese Skin- rash-none, lesions- none, excoriation- none Lymphadenopathy- none Head- atraumatic            Eyes- Gross vision intact, PERRLA, conjunctivae and secretions clear            Ears- Hearing, canals-normal            Nose- Clear, no-Septal dev, mucus, polyps, erosion, perforation             Throat- Mallampati III , mucosa clear , drainage- none, tonsils+ residual, + teeth Neck- flexible , trachea midline, no stridor , thyroid nl, carotid no bruit Chest - symmetrical excursion , unlabored           Heart/CV- RRR , no murmur , no gallop  , no rub, nl s1 s2                           - JVD- none , edema- none, stasis changes- none, varices- none           Lung- clear to P&A, wheeze- none, cough- none , dullness-none, rub- none           Chest wall-  Abd-  Br/ Gen/ Rectal- Not done, not indicated Extrem- cyanosis- none, clubbing, none, atrophy- none, strength- nl Neuro- grossly intact to observation

## 2019-09-04 ENCOUNTER — Encounter: Payer: Self-pay | Admitting: Internal Medicine

## 2019-09-15 ENCOUNTER — Other Ambulatory Visit: Payer: Self-pay | Admitting: Family Medicine

## 2019-09-15 DIAGNOSIS — K219 Gastro-esophageal reflux disease without esophagitis: Secondary | ICD-10-CM

## 2019-10-09 ENCOUNTER — Ambulatory Visit: Payer: 59 | Admitting: Family Medicine

## 2019-10-09 ENCOUNTER — Encounter: Payer: Self-pay | Admitting: Family Medicine

## 2019-10-09 ENCOUNTER — Other Ambulatory Visit: Payer: Self-pay

## 2019-10-09 VITALS — BP 158/98 | HR 82 | Temp 98.2°F | Ht 71.0 in | Wt 236.0 lb

## 2019-10-09 DIAGNOSIS — R1013 Epigastric pain: Secondary | ICD-10-CM | POA: Diagnosis not present

## 2019-10-09 DIAGNOSIS — M25511 Pain in right shoulder: Secondary | ICD-10-CM

## 2019-10-09 DIAGNOSIS — Z0289 Encounter for other administrative examinations: Secondary | ICD-10-CM

## 2019-10-09 MED ORDER — MELOXICAM 15 MG PO TABS: 15.0000 mg | ORAL_TABLET | Freq: Every day | ORAL | 0 refills | Status: DC

## 2019-10-09 NOTE — Patient Instructions (Addendum)
Heat (pad or rice pillow in microwave) over affected area, 10-15 minutes twice daily.   Ice/cold pack over area for 10-15 min twice daily.  OK to take Tylenol 1000 mg (2 extra strength tabs) or 975 mg (3 regular strength tabs) every 6 hours as needed.  We are going to check your breath tests in 2 weeks. Don't take your pantoprazole until then if symptoms get worse, let me know. I will likely change your medicine to something else and recommend getting back in with the GI team.   Acromioclavicular Separation Rehab It is normal to feel mild stretching, pulling, tightness, or discomfort as you do these exercises, but you should stop right away if you feel sudden pain or your pain gets worse.  Stretching and range of motion exercises These exercises warm up your muscles and joints and improve the movement and flexibility of your shoulder. These exercises also help to relieve pain and stiffness. Exercise A: Pendulum  1. Stand near a wall or a surface that you can hold onto for balance. 2. Bend forward at the waist and let your left / right arm hang straight down. Use your other arm to keep your balance. 3. Relax your arm and shoulder muscles, and move your hips and your trunk so your left / right arm swings freely. Your arm should swing because of the motion of your body, not because you are using your arm or shoulder muscles. 4. Keep moving so your arm swings in the following directions, as told by your health care provider: ? Side to side. ? Forward and backward. ? In clockwise and counterclockwise circles. 5. Slowly return to the starting position. Repeat 2 times, or for 30 seconds per direction. Complete this exercise 3 times per week. Exercise B: Flexion, seated  1. Sit in a stable chair and rest your left / right forearm on a flat surface. Your elbow should rest at a height that keeps your upper arm next to your body. 2. Keeping your left / right shoulder relaxed, lean forward at the  waist and slide your hand forward until you feel a stretch in your shoulder. You can move your chair farther away from the surface to increase the stretch, if needed. 3. Hold for 30 seconds. 4. Slowly return to the starting position. Repeat 2 times. Complete this exercise 3 times per week.  Strengthening exercises These exercises build strength and endurance in your shoulder. Endurance is the ability to use your muscles for a long time, even after they get tired. Exercise C: Scapular retraction  1. Sit in a stable chair without armrests, or stand. 2. Secure an exercise band to a stable object in front of you so the band is at shoulder height. 3. Hold one end of the exercise band in each hand. 4. Squeeze your shoulder blades together and move your elbows slightly behind you. Do not shrug your shoulders while you do this. 5. Hold for 3 seconds. 6. Slowly return to the starting position. Repeat 2 times. Complete this exercise 3 times per week. Exercise D: Shoulder abduction 1. Sit in a stable chair without arm rests, or stand. 2. Hold a 5 weight in your left / right hand. 3. Start with your arms straight down. Turn your left / right hand so your palm faces in, toward your body. 4. Slowly lift your right / left hand out to your side. Do not lift your hand above shoulder height. ? Keep your arms straight. ? Avoid shrugging your  shoulder while you do this movement. Keep your shoulder blade tucked down toward the middle of your back. 5. Hold for 3 seconds. 6. Slowly lower your arm and return to the starting position. Repeat 2 times. Complete this exercise 3 times per week. Exercise E: Scapular protraction, standing  1. Stand so you are facing a wall. Place your feet about one arm-length away from the wall. 2. Place your hands on the wall and straighten your elbows. 3. Keep your hands on the wall as you push your upper back away from the wall. You should feel your shoulder blades sliding  forward around your rib cage. Keep your elbows and your head still. 4. Hold for 3 seconds. 5. Slowly return to the starting position. Let your muscles relax completely before you repeat this exercise. Repeat 2 times. Complete this exercise 3 times per week.  This information is not intended to replace advice given to you by your health care provider. Make sure you discuss any questions you have with your health care provider. Document Released: 01/12/2005 Document Revised: 09/19/2015 Document Reviewed: 12/15/2014 Elsevier Interactive Patient Education  2018 [employer]   Biceps Tendon Disruption (Proximal) Rehab Do exercises exactly as told by your health care provider and adjust them as directed. It is normal to feel mild stretching, pulling, tightness, or discomfort as you do these exercises, but you should stop right away if you feel sudden pain or your pain gets worse. Stretching and range of motion exercises These exercises warm up your muscles and joints and improve the movement and flexibility of your arm and shoulder. These exercises also help to relieve pain and stiffness. Exercise A: Shoulder flexion, standing   1. Stand facing a wall. Put your left / right hand on the wall. 2. Slide your left / right hand up the wall. Stop when you feel a stretch in your shoulder, or when you reach the angle recommended by your health care provider.  Use your other hand to help raise your arm, if needed.  As your hand gets higher, you may need to step closer to the wall.  Avoid shrugging your shoulder while you raise your arm. To do this, keep your shoulder blade tucked down toward your spine. 3. Hold for 30 seconds. 4. Slowly return to the starting position. Use your other arm to help, if needed. Repeat 2 times. Complete this exercise 3 times per week. Exercise B: Pendulum   1. Stand near a wall or a surface that you can hold onto for balance. 2. Bend at the waist and let your left /  right arm hang straight down. Use your other arm to support you. 3. Relax your arm and shoulder muscles, and move your hips and your trunk so your left / right arm swings freely. Your arm should swing because of the motion of your body, not because you are using your arm or shoulder muscles. 4. Keep moving so your arm swings in the following directions, as told by your health care provider:  Side to side.  Forward and backward.  In clockwise and counterclockwise circles. 5. Slowly return to the starting position. Repeat 2 times. Complete this exercise 3 times per week.  Strengthening exercises These exercises build strength and endurance in your arm and shoulder. Endurance is the ability to use your muscles for a long time, even after your muscles get tired. Exercise C: Elbow flexion, neutral  1. Sit on a stable chair without armrests, or stand. 2. Hold a  3-5 lb weight in your left / right hand, or hold an exercise band with both hands. Your palms should face each other at the starting position. 3. Bend your left / right elbow and move your hand up toward your shoulder.  Lead with your thumb, and keep your palm facing the same direction.  Keep your other arm straight down, in the starting position. 4. Slowly return to the starting position. Repeat 2-3 times. Complete this exercise 3 times per week. Exercise D: Forearm supination   1. Sit with your left / right forearm on a table. Your elbow should be below shoulder height. Rest your hand over the edge of the table so your palm faces down. 2. If directed, hold a hammer with your left / right hand. 3. Without moving your elbow, slowly rotate your hand so your palm faces up toward the ceiling.  If you are holding a hammer, begin by holding the hammer near the head. When this exercise gets easier for you, hold the hammer farther down the handle. 4. Hold for 3 seconds. 5. Slowly return to the starting position. Repeat 2 times. Complete  this exercise 3 times per week. Exercise E: Scapular retraction   6. Sit in a stable chair without armrests, or stand. 7. Secure an exercise band to a stable object in front of you so the band is at shoulder height. 8. Hold one end of the exercise band in each hand. 9. Squeeze your shoulder blades together and move your elbows slightly behind you. Do not shrug your shoulders. 10. Hold for 3 seconds. 11. Slowly return to the starting position. Repeat 2 times. Complete this exercise 3 times per week. Exercise F: Scapular protraction, supine   1. Lie on your back on a firm surface. Hold a 3-5 lb weight in your left / right hand. 2. Raise your left / right arm straight into the air so your hand is directly above your shoulder joint. 3. Push the weight into the air so your shoulder lifts off of the surface that you are lying on. Do not move your head, neck, or back. 4. Hold for 3 seconds. 5. Slowly return to the starting position. Let your muscles relax completely before you repeat this exercise. Repeat 2 times. Complete this exercise 3 times per week. This information is not intended to replace advice given to you by your health care provider. Make sure you discuss any questions you have with your health care provider. Document Released: 01/12/2005 Document Revised: 09/19/2015 Document Reviewed: 12/21/2014 Elsevier Interactive Patient Education  2017 [employer].

## 2019-10-09 NOTE — Progress Notes (Signed)
Musculoskeletal Exam  Patient: Trevor Lewis DOB: October 06, 1980  DOS: 10/09/2019  SUBJECTIVE:  Chief Complaint:   Chief Complaint  Patient presents with  . Shoulder Pain    right  . Abdominal Pain    Trevor Lewis is a 39 y.o.  male for evaluation and treatment of R shoulder pain. Here w his wife.   Onset:  2 weeks ago. Lifting a cabinet Location: top of shoulder Character:  aching and sharp  Progression of issue:  is unchanged Associated symptoms: hurts to go through rom, some clicking but no catching/locking Treatment: to date has been acetaminophen.   Neurovascular symptoms: no  1 week started having worsening epigastric pain. Has been taking Protonix w little relief, usually uses prn. Failed omeprazole in past. Having reflux, some regurgitation, slight nausea, no fevers or bowel changes.   Past Medical History:  Diagnosis Date  . Carpal tunnel syndrome 02/17/2016  . GERD (gastroesophageal reflux disease) 02/17/2016  . Hypertension   . Pericardial effusion   . TB lung, latent     Objective: VITAL SIGNS: BP (!) 158/98 (BP Location: Right Arm, Patient Position: Sitting, Cuff Size: Large)   Pulse 82   Temp 98.2 F (36.8 C) (Oral)   Ht 5\' 11"  (1.803 m)   Wt 236 lb (107 kg)   SpO2 95%   BMI 32.92 kg/m  Constitutional: Well formed, well developed. No acute distress. Thorax & Lungs: No accessory muscle use Musculoskeletal: R shoulder.   Normal active range of motion: yes.   Normal passive range of motion: yes Tenderness to palpation: yes over prox biceps tendon and Ac jt Deformity: no Ecchymosis: no Tests positive: Speed's, cross over, cross over Obrien's Tests negative: Hawkins, empty can, Neer's, lift off, obrien's Neurologic: Normal sensory function.  Psychiatric: Normal mood. Age appropriate judgment and insight. Alert & oriented x 3.    Assessment:  Acute pain of right shoulder - Plan: meloxicam (MOBIC) 15 MG tablet  Epigastric abdominal pain - Plan: H.  pylori breath test  Plan: 1. Stretches/exercises, heat, ice, Tylenol.  2. Took Protonix yesterday. Opted to hold this and check breath test in 2 weeks. If he cannot tolerate this w his s/s's, I will send in Nexium and refer him back to Dr. .  F/u prn. The patient and his wife voiced understanding and agreement to the plan.   Leone Payor [place], DO 10/09/19  2:52 PM

## 2019-10-20 ENCOUNTER — Ambulatory Visit: Payer: 59 | Admitting: Internal Medicine

## 2019-10-20 NOTE — Progress Notes (Deleted)
07/14/19- 39 yoM never smoker from [place],  for sleep evaluation, courtesy of Dr Carmelia Roller. Medical problem list includes DM2, HTN, Epworth score 11  Body weight today 230 lbs He reports difficulty initiating and maintaining sleep especially in recent years. Admits some daytime tiredness. Occasional coffee, no sleep meds. No ENT surgery or heart/ lung or seizure problems. Wife tells him of loud snoring and witnessed apneas. He doesn't indicate if his [place] residency status might be under current threat and if those issues might be impacting sleep.  10/20/19- 39 yoM never smoker from [place] followed for OSA   ROS-see HPI   + = positive Constitutional:    weight loss, night sweats, fevers, chills, fatigue, lassitude. HEENT:    headaches, difficulty swallowing, tooth/dental problems, sore throat,       sneezing, itching, ear ache, nasal congestion, post nasal drip, snoring CV:    chest pain, orthopnea, PND, swelling in lower extremities, anasarca,                                  dizziness, palpitations Resp:   shortness of breath with exertion or at rest.                productive cough,   non-productive cough, coughing up of blood.              change in color of mucus.  wheezing.   Skin:    rash or lesions. GI:  No-   heartburn, indigestion, abdominal pain, nausea, vomiting, diarrhea,                 change in bowel habits, loss of appetite GU: dysuria, change in color of urine, no urgency or frequency.   flank pain. MS:   joint pain, stiffness, decreased range of motion, back pain. Neuro-     nothing unusual Psych:  change in mood or affect.  depression or anxiety.   memory loss.  OBJ- Physical Exam General- Alert, Oriented, Affect-appropriate, Distress- none acute, + muscular, not obese Skin- rash-none, lesions- none, excoriation- none Lymphadenopathy- none Head- atraumatic            Eyes- Gross vision intact, PERRLA, conjunctivae and secretions clear            Ears- Hearing,  canals-normal            Nose- Clear, no-Septal dev, mucus, polyps, erosion, perforation             Throat- Mallampati III , mucosa clear , drainage- none, tonsils+ residual, + teeth Neck- flexible , trachea midline, no stridor , thyroid nl, carotid no bruit Chest - symmetrical excursion , unlabored           Heart/CV- RRR , no murmur , no gallop  , no rub, nl s1 s2                           - JVD- none , edema- none, stasis changes- none, varices- none           Lung- clear to P&A, wheeze- none, cough- none , dullness-none, rub- none           Chest wall-  Abd-  Br/ Gen/ Rectal- Not done, not indicated Extrem- cyanosis- none, clubbing, none, atrophy- none, strength- nl Neuro- grossly intact to observation

## 2019-10-23 ENCOUNTER — Other Ambulatory Visit (INDEPENDENT_AMBULATORY_CARE_PROVIDER_SITE_OTHER): Payer: 59

## 2019-10-23 ENCOUNTER — Other Ambulatory Visit: Payer: Self-pay

## 2019-10-23 DIAGNOSIS — R1013 Epigastric pain: Secondary | ICD-10-CM

## 2019-10-24 ENCOUNTER — Other Ambulatory Visit: Payer: Self-pay | Admitting: Family Medicine

## 2019-10-24 LAB — H. PYLORI BREATH TEST: H. pylori Breath Test: DETECTED — AB

## 2019-10-24 MED ORDER — AMOXICILL-CLARITHRO-LANSOPRAZ PO MISC: ORAL | 0 refills | Status: DC

## 2019-10-26 ENCOUNTER — Telehealth: Payer: Self-pay

## 2019-10-26 NOTE — Telephone Encounter (Signed)
PA initiated via Covermymeds: KEY: [id number]. Awaiting determination.

## 2019-10-27 MED ORDER — CLARITHROMYCIN 500 MG PO TABS: 500.0000 mg | ORAL_TABLET | Freq: Two times a day (BID) | ORAL | 0 refills | Status: DC

## 2019-10-27 MED ORDER — CLARITHROMYCIN 250 MG PO TABS: 250.0000 mg | ORAL_TABLET | Freq: Two times a day (BID) | ORAL | 0 refills | Status: DC

## 2019-10-27 MED ORDER — LANSOPRAZOLE 30 MG PO CPDR: 30.0000 mg | DELAYED_RELEASE_CAPSULE | Freq: Two times a day (BID) | ORAL | 0 refills | Status: DC

## 2019-10-27 NOTE — Addendum Note (Signed)
Addended by: Scharlene Gloss B on: 10/27/2019 09:51 AM   Modules accepted: Orders

## 2019-10-27 NOTE — Telephone Encounter (Signed)
There is no clarithromycin 250, just the 500 mg dosage. Ty.

## 2019-10-27 NOTE — Telephone Encounter (Signed)
PA denied, awaiting denial information.  

## 2019-10-27 NOTE — Telephone Encounter (Signed)
That's fine. Ty.  

## 2019-10-27 NOTE — Telephone Encounter (Signed)
Also he is on atorvastatin and may counteract

## 2019-10-27 NOTE — Telephone Encounter (Signed)
Pharmacy informed.

## 2019-10-27 NOTE — Telephone Encounter (Signed)
Advise on Atorvastatin

## 2019-10-27 NOTE — Telephone Encounter (Signed)
OK to send in, sig is take 2 times daily for 14 days for all. Ty.

## 2019-10-27 NOTE — Telephone Encounter (Signed)
Sent in alternatives

## 2019-10-27 NOTE — Telephone Encounter (Signed)
Clarify please I sent in the lansoprazole 30, clarithromycin 250 and 500, but pharmacy is questioning if this is correct

## 2019-10-27 NOTE — Telephone Encounter (Signed)
Insurance does not cover combo pack, they are requesting all 3 medications separately. Formulary alternatives are: lansoprazole 30mg , clarithromycin 500mg , clarithromycin 250mg , amoxicillin oral suspension reconstituted 400mg /59mL, amoxicillin 875mg  or amoxicillin 500mg .

## 2019-11-17 ENCOUNTER — Other Ambulatory Visit: Payer: Self-pay | Admitting: Family Medicine

## 2019-11-17 DIAGNOSIS — M25511 Pain in right shoulder: Secondary | ICD-10-CM

## 2019-11-23 ENCOUNTER — Other Ambulatory Visit: Payer: Self-pay | Admitting: Family Medicine

## 2020-01-09 ENCOUNTER — Ambulatory Visit: Payer: 59 | Admitting: Family Medicine

## 2020-01-09 DIAGNOSIS — Z0289 Encounter for other administrative examinations: Secondary | ICD-10-CM

## 2020-05-02 ENCOUNTER — Other Ambulatory Visit: Payer: Self-pay | Admitting: Family Medicine

## 2020-05-02 DIAGNOSIS — I10 Essential (primary) hypertension: Secondary | ICD-10-CM

## 2020-07-28 ENCOUNTER — Other Ambulatory Visit: Payer: Self-pay | Admitting: Family Medicine

## 2020-08-12 MED ORDER — ALBUTEROL SULFATE HFA 108 (90 BASE) MCG/ACT IN AERS: 2.0000 | INHALATION_SPRAY | Freq: Four times a day (QID) | RESPIRATORY_TRACT | 1 refills | Status: DC | PRN

## 2020-08-14 ENCOUNTER — Other Ambulatory Visit: Payer: Self-pay

## 2020-08-14 ENCOUNTER — Emergency Department (HOSPITAL_BASED_OUTPATIENT_CLINIC_OR_DEPARTMENT_OTHER): Payer: 59

## 2020-08-14 ENCOUNTER — Encounter (HOSPITAL_BASED_OUTPATIENT_CLINIC_OR_DEPARTMENT_OTHER): Payer: Self-pay | Admitting: Emergency Medicine

## 2020-08-14 ENCOUNTER — Observation Stay (HOSPITAL_COMMUNITY): Payer: 59

## 2020-08-14 ENCOUNTER — Observation Stay (HOSPITAL_BASED_OUTPATIENT_CLINIC_OR_DEPARTMENT_OTHER)
Admission: EM | Admit: 2020-08-14 | Discharge: 2020-08-15 | Disposition: A | Discharge status: Home / Self Care | Payer: 59 | Attending: Internal Medicine | Admitting: Internal Medicine

## 2020-08-14 DIAGNOSIS — R072 Precordial pain: Secondary | ICD-10-CM | POA: Diagnosis not present

## 2020-08-14 DIAGNOSIS — I10 Essential (primary) hypertension: Secondary | ICD-10-CM | POA: Diagnosis not present

## 2020-08-14 DIAGNOSIS — R079 Chest pain, unspecified: Secondary | ICD-10-CM

## 2020-08-14 DIAGNOSIS — E119 Type 2 diabetes mellitus without complications: Secondary | ICD-10-CM | POA: Diagnosis not present

## 2020-08-14 DIAGNOSIS — E1165 Type 2 diabetes mellitus with hyperglycemia: Secondary | ICD-10-CM | POA: Diagnosis present

## 2020-08-14 DIAGNOSIS — E785 Hyperlipidemia, unspecified: Secondary | ICD-10-CM

## 2020-08-14 DIAGNOSIS — Z79899 Other long term (current) drug therapy: Secondary | ICD-10-CM | POA: Insufficient documentation

## 2020-08-14 DIAGNOSIS — Z7984 Long term (current) use of oral hypoglycemic drugs: Secondary | ICD-10-CM | POA: Insufficient documentation

## 2020-08-14 DIAGNOSIS — Z20822 Contact with and (suspected) exposure to covid-19: Secondary | ICD-10-CM | POA: Diagnosis not present

## 2020-08-14 DIAGNOSIS — K219 Gastro-esophageal reflux disease without esophagitis: Secondary | ICD-10-CM | POA: Diagnosis present

## 2020-08-14 DIAGNOSIS — M25519 Pain in unspecified shoulder: Secondary | ICD-10-CM | POA: Diagnosis not present

## 2020-08-14 LAB — BASIC METABOLIC PANEL
Anion gap: 10 (ref 5–15)
BUN: 8 mg/dL (ref 6–20)
CO2: 29 mmol/L (ref 22–32)
Calcium: 9.1 mg/dL (ref 8.9–10.3)
Chloride: 98 mmol/L (ref 98–111)
Creatinine, Ser: 1.1 mg/dL (ref 0.61–1.24)
GFR, Estimated: >60 mL/min (ref >60)
Glucose, Bld: 132 mg/dL — ABNORMAL HIGH (ref 70–99)
Potassium: 4.1 mmol/L (ref 3.5–5.1)
Sodium: 137 mmol/L (ref 135–145)

## 2020-08-14 LAB — HEMOGLOBIN A1C
Hgb A1c MFr Bld: 6.6 % — ABNORMAL HIGH (ref 4.8–5.6)
Mean Plasma Glucose: 142.72 mg/dL

## 2020-08-14 LAB — TROPONIN I (HIGH SENSITIVITY)
Troponin I (High Sensitivity): 5 ng/L (ref <18)
Troponin I (High Sensitivity): 4 ng/L (ref <18)

## 2020-08-14 LAB — CBC
HCT: 50.4 % (ref 39.0–52.0)
HCT: 51.3 % (ref 39.0–52.0)
Hemoglobin: 16.8 g/dL (ref 13.0–17.0)
Hemoglobin: 17.1 g/dL — ABNORMAL HIGH (ref 13.0–17.0)
MCH: 30.5 pg (ref 26.0–34.0)
MCH: 30.9 pg (ref 26.0–34.0)
MCHC: 33.3 g/dL (ref 30.0–36.0)
MCHC: 33.3 g/dL (ref 30.0–36.0)
MCV: 91.6 fL (ref 80.0–100.0)
MCV: 92.6 fL (ref 80.0–100.0)
Platelets: 277 10*3/uL (ref 150–400)
Platelets: 286 10*3/uL (ref 150–400)
RBC: 5.5 MIL/uL (ref 4.22–5.81)
RBC: 5.54 MIL/uL (ref 4.22–5.81)
RDW: 11.9 % (ref 11.5–15.5)
RDW: 11.9 % (ref 11.5–15.5)
WBC: 5.6 10*3/uL (ref 4.0–10.5)
WBC: 5.1 10*3/uL (ref 4.0–10.5)
nRBC: 0 % (ref 0.0–0.2)
nRBC: 0 % (ref 0.0–0.2)

## 2020-08-14 LAB — GLUCOSE, CAPILLARY
Glucose-Capillary: 109 mg/dL — ABNORMAL HIGH (ref 70–99)
Glucose-Capillary: 140 mg/dL — ABNORMAL HIGH (ref 70–99)

## 2020-08-14 LAB — HIV ANTIBODY (ROUTINE TESTING W REFLEX): HIV Screen 4th Generation wRfx: NONREACTIVE

## 2020-08-14 LAB — RESP PANEL BY RT-PCR (FLU A&B, COVID) ARPGX2
Influenza A by PCR: NEGATIVE
Influenza B by PCR: NEGATIVE
SARS Coronavirus 2 by RT PCR: NEGATIVE

## 2020-08-14 LAB — CREATININE, SERUM
Creatinine, Ser: 0.95 mg/dL (ref 0.61–1.24)
GFR, Estimated: >60 mL/min (ref >60)

## 2020-08-14 MED ORDER — METOPROLOL TARTRATE 100 MG PO TABS: 100.0000 mg | ORAL_TABLET | ORAL | Status: DC

## 2020-08-14 MED ORDER — ASPIRIN EC 81 MG PO TBEC
81.0000 mg | DELAYED_RELEASE_TABLET | Freq: Every day | ORAL | Status: DC
  Administered 2020-08-14 – 2020-08-15 (×2): 81 mg via ORAL
  Filled 2020-08-14 (×2): qty 1

## 2020-08-14 MED ORDER — ENOXAPARIN SODIUM 40 MG/0.4ML IJ SOSY
40.0000 mg | PREFILLED_SYRINGE | INTRAMUSCULAR | Status: DC
  Administered 2020-08-14: 40 mg via SUBCUTANEOUS
  Filled 2020-08-14: qty 0.4

## 2020-08-14 MED ORDER — ONDANSETRON HCL 4 MG/2ML IJ SOLN: 4.0000 mg | Freq: Four times a day (QID) | INTRAMUSCULAR | Status: DC | PRN

## 2020-08-14 MED ORDER — INSULIN ASPART 100 UNIT/ML IJ SOLN: 0.0000 [IU] | Freq: Every day | INTRAMUSCULAR | Status: DC

## 2020-08-14 MED ORDER — PANTOPRAZOLE SODIUM 40 MG PO TBEC
40.0000 mg | DELAYED_RELEASE_TABLET | Freq: Every day | ORAL | Status: DC
  Administered 2020-08-14 – 2020-08-15 (×2): 40 mg via ORAL
  Filled 2020-08-14 (×2): qty 1

## 2020-08-14 MED ORDER — ATORVASTATIN CALCIUM 40 MG PO TABS
40.0000 mg | ORAL_TABLET | Freq: Every day | ORAL | Status: DC
  Administered 2020-08-14 – 2020-08-15 (×2): 40 mg via ORAL
  Filled 2020-08-14 (×2): qty 1

## 2020-08-14 MED ORDER — NITROGLYCERIN 0.4 MG SL SUBL: 0.4000 mg | SUBLINGUAL_TABLET | SUBLINGUAL | Status: DC | PRN

## 2020-08-14 MED ORDER — ACETAMINOPHEN 325 MG PO TABS: 650.0000 mg | ORAL_TABLET | ORAL | Status: DC | PRN

## 2020-08-14 MED ORDER — IRBESARTAN 300 MG PO TABS
150.0000 mg | ORAL_TABLET | Freq: Every day | ORAL | Status: DC
  Administered 2020-08-14: 150 mg via ORAL
  Filled 2020-08-14: qty 1

## 2020-08-14 MED ORDER — INSULIN ASPART 100 UNIT/ML IJ SOLN
0.0000 [IU] | Freq: Three times a day (TID) | INTRAMUSCULAR | Status: DC
  Administered 2020-08-15: 2 [IU] via SUBCUTANEOUS

## 2020-08-14 MED ORDER — METOPROLOL TARTRATE 100 MG PO TABS
100.0000 mg | ORAL_TABLET | ORAL | Status: DC
  Filled 2020-08-14: qty 1

## 2020-08-14 MED ORDER — CARVEDILOL 3.125 MG PO TABS
3.1250 mg | ORAL_TABLET | Freq: Two times a day (BID) | ORAL | Status: DC
  Administered 2020-08-14 – 2020-08-15 (×3): 3.125 mg via ORAL
  Filled 2020-08-14 (×3): qty 1

## 2020-08-14 NOTE — ED Notes (Signed)
[facility] called for report, pt resting comfortably given a snack.  ETA 15 min

## 2020-08-14 NOTE — ED Provider Notes (Signed)
[facility] EMERGENCY DEPARTMENT Provider Note   CSN: [id number] Arrival date & time: 08/14/20  1024     History Chief Complaint  Patient presents with   Chest Pain    Caisen Mangas is a 40 y.o. male.  Patient is a 40 year old male with a history of diabetes, hypertension, GERD who presents with chest pain.  He states he had a little bit 2 days ago in the evening but it resolved and then came back again last night.  He describes it as a pressure feeling in his left chest.  He has some radiation to his shoulder.  It somewhat worse with movement of the left shoulder.  He has some associated shortness of breath.  No nausea or vomiting.  No diaphoresis.  He says it does get worse with ambulation and eases off when he sits down and rest.  He says it has eased off now and has minimal symptoms currently.  He does have a prior history of a large pericardial effusion that required drainage in 2013.  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  He has not had any known complications since that time.  He reports that this pain feels similar to that episode.  Although its not as bad.   HPI: A 40 year old patient with a history of treated diabetes, hypertension, hypercholesterolemia and obesity presents for evaluation of chest pain. Initial onset of pain was approximately 3-6 hours ago. The patient's chest pain is described as heaviness/pressure/tightness and is worse with exertion. The patient's chest pain is middle- or left-sided, is not well-localized, is not sharp and does radiate to the arms/jaw/neck. The patient does not complain of nausea and denies diaphoresis. The patient has no history of stroke, has no history of peripheral artery disease, has not smoked in the past 90 days and has no relevant family history of coronary artery disease (first degree relative at less than age 65).   Past Medical History:  Diagnosis Date   Carpal  tunnel syndrome 02/17/2016   GERD (gastroesophageal reflux disease) 02/17/2016   Hypertension    Pericardial effusion    TB lung, latent     Patient Active Problem List   Diagnosis Date Noted   Insomnia 07/14/2019   Type 2 diabetes mellitus with hyperglycemia, without long-term current use of insulin ([place]) 12/27/2018   Snoring 12/27/2018   Fatigue 12/27/2018   Essential hypertension 03/25/2016   GERD (gastroesophageal reflux disease) 02/17/2016   Carpal tunnel syndrome of left wrist 02/17/2016    Past Surgical History:  Procedure Laterality Date   COLONOSCOPY  2013?   PERICARDIOCENTESIS     UPPER GASTROINTESTINAL ENDOSCOPY  2013   hiatal hernia       Family History  Problem Relation Age of Onset   Colon cancer Neg Hx    Esophageal cancer Neg Hx     Social History   Tobacco Use   Smoking status: Never   Smokeless tobacco: Never  Substance Use Topics   Alcohol use: No   Drug use: No    Home Medications Prior to Admission medications   Medication Sig Start Date End Date Taking? Authorizing Provider  albuterol (VENTOLIN HFA) 108 (90 Base) MCG/ACT inhaler Inhale 2 puffs into the lungs every 6 (six) hours as needed for wheezing or shortness of breath. 08/12/20   Shelda Pal, DO  atorvastatin (LIPITOR) 40 MG tablet Take 1 tablet (40 mg total) by mouth daily. 12/27/18   Wendling,  Crosby Oyster, DO  clarithromycin (BIAXIN) 250 MG tablet Take 1 tablet (250 mg total) by mouth 2 (two) times daily. 10/27/19   Wendling, Crosby Oyster, DO  clarithromycin (BIAXIN) 500 MG tablet Take 1 tablet (500 mg total) by mouth 2 (two) times daily. 10/27/19   Shelda Pal, DO  lansoprazole (PREVACID) 30 MG capsule Take 1 capsule (30 mg total) by mouth 2 (two) times daily before a meal. 11/23/19   Wendling, Crosby Oyster, DO  meloxicam (MOBIC) 15 MG tablet TAKE 1 TABLET BY MOUTH EVERY DAY 11/17/19   Shelda Pal, DO  metFORMIN (GLUCOPHAGE) 500 MG tablet TAKE 1  TABLET (500 MG TOTAL) BY MOUTH 2 (TWO) TIMES DAILY WITH A MEAL. 07/30/20   Shelda Pal, DO  valsartan (DIOVAN) 160 MG tablet TAKE 1 TABLET BY MOUTH EVERY DAY 05/02/20   Shelda Pal, DO    Allergies    Patient has no known allergies.  Review of Systems   Review of Systems  Constitutional:  Negative for chills, diaphoresis, fatigue and fever.  HENT:  Negative for congestion, rhinorrhea and sneezing.   Eyes: Negative.   Respiratory:  Positive for chest tightness and shortness of breath. Negative for cough.   Cardiovascular:  Negative for chest pain and leg swelling.  Gastrointestinal:  Negative for abdominal pain, blood in stool, diarrhea, nausea and vomiting.  Genitourinary:  Negative for difficulty urinating, flank pain, frequency and hematuria.  Musculoskeletal:  Negative for arthralgias and back pain.  Skin:  Negative for rash.  Neurological:  Negative for dizziness, speech difficulty, weakness, numbness and headaches.   Physical Exam Updated Vital Signs BP (!) 154/113   Pulse 60   Temp 98.2 F (36.8 C) (Oral)   Resp 17   Ht _0  (1.803 m)   Wt 99.8 kg   SpO2 98%   BMI 30.68 kg/m   Physical Exam Constitutional:      Appearance: He is well-developed.  HENT:     Head: Normocephalic and atraumatic.  Eyes:     Pupils: Pupils are equal, round, and reactive to light.  Cardiovascular:     Rate and Rhythm: Normal rate and regular rhythm.     Heart sounds: Normal heart sounds.  Pulmonary:     Effort: Pulmonary effort is normal. No respiratory distress.     Breath sounds: Normal breath sounds. No wheezing or rales.  Chest:     Chest wall: No tenderness.  Abdominal:     General: Bowel sounds are normal.     Palpations: Abdomen is soft.     Tenderness: There is no abdominal tenderness. There is no guarding or rebound.  Musculoskeletal:        General: Normal range of motion.     Cervical back: Normal range of motion and neck supple.     Right lower  leg: No edema.     Left lower leg: No edema.  Lymphadenopathy:     Cervical: No cervical adenopathy.  Skin:    General: Skin is warm and dry.     Findings: No rash.  Neurological:     Mental Status: He is alert and oriented to person, place, and time.    ED Results / Procedures / Treatments   Labs (all labs ordered are listed, but only abnormal results are displayed) Labs Reviewed  BASIC METABOLIC PANEL - Abnormal; Notable for the following components:      Result Value   Glucose, Bld 132 (*)    All other  components within normal limits  CBC  TROPONIN I (HIGH SENSITIVITY)  TROPONIN I (HIGH SENSITIVITY)    EKG EKG Interpretation  Date/Time:  Wednesday August 14 2020 10:32:03 EDT Ventricular Rate:  86 PR Interval:  145 QRS Duration: 82 QT Interval:  349 QTC Calculation: 418 R Axis:   -57 Text Interpretation: Sinus rhythm Probable left atrial enlargement Inferior infarct, old ST elevation, consider anterior injury since last tracing no significant change Confirmed by Malvin Johns [phone]) on 08/14/2020 10:37:22 AM  Radiology DG Chest Port 1 View  Result Date: 08/14/2020 CLINICAL DATA:  Chest pain. EXAM: PORTABLE CHEST 1 VIEW COMPARISON:  01/10/2019. FINDINGS: Mediastinum hilar structures normal. Cardiomegaly. No pulmonary venous congestion. No focal infiltrate. No pleural effusion or pneumothorax. IMPRESSION: Cardiomegaly. No pulmonary venous congestion. No acute pulmonary disease. Electronically Signed   By: Marcello Moores  Register   On: 08/14/2020 11:15    Procedures Procedures   Medications Ordered in ED Medications - No data to display  ED Course  I have reviewed the triage vital signs and the nursing notes.  Pertinent labs & imaging results that were available during my care of the patient were reviewed by me and considered in my medical decision making (see chart for details).    MDM Rules/Calculators/A&P HEAR Score: 5                         Patient is a  40 year old male who presents with chest pain.  He has exertional symptoms.  He has a heart score of 5.  He has a prior history of pericardial effusion.  Given this, I feel it is best to admit him for observation and likely get an echo.  Patient request to go to [facility].  I have spoken to Dr. Sherryll Burger who has accepted the patient although they do not currently have any beds.  He is currently on a wait list.  Patient is talking to his wife to see if he wants to stay on the wait list or try to go to [facility] Final Clinical Impression(s) / ED Diagnoses Final diagnoses:  Chest pain, unspecified type    Rx / DC Orders ED Discharge Orders     None        Malvin Johns, MD 08/14/20 1411

## 2020-08-14 NOTE — Consult Note (Signed)
Cardiology Consultation:   Patient ID: Eames Dibiasio MRN: [id number]; DOB: 1980/05/16  Admit date: 08/14/2020 Date of Consult: 08/14/2020  PCP:  Sharlene Dory, DO   [facility] Providers Cardiologist:  None        Patient Profile:   Trevor Lewis is a 40 y.o. male with a hx of HTN, DM, HLD and remote pericardial effusion who is being seen 08/14/2020 for the evaluation of chest pain at the request of Dr Kerry Hough.  History of Present Illness:   Trevor Lewis is a 40 yo [place] male with history of HTN, DM, and HLD seen for evaluation of chest pain. He has a remote history of large pericardial effusion in September 2013 and underwent pericardiocentesis and [facility]. Subsequent Echos showed no recurrent effusion - most recently in June 2019 at [facility]. 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 It was unclear if this and the effusion were related.  Patient reports that Sunday pm he developed left precordial chest pain. He did not take any medication but noted his BP was high. He went to sleep and pain was resolved by am. This morning pain recurred and did not resolve so he went to [facility] [place] [facility]. There CXR was clear. Ecg showed no acute change and troponin negative x 2. He is still hypertensive. Reports he takes Protonix for GERD and valsartan 160 mg for BP. Takes metformin PRN because he states his BS will drop if he takes regularly. Has not been taking statin Rx. He does not smoke and has no family history of heart disease.    Past Medical History:  Diagnosis Date   Carpal tunnel syndrome 02/17/2016   GERD (gastroesophageal reflux disease) 02/17/2016   Hypertension    Pericardial effusion    TB lung, latent     Past Surgical History:  Procedure Laterality Date   COLONOSCOPY  2013?   PERICARDIOCENTESIS     UPPER GASTROINTESTINAL ENDOSCOPY  2013   hiatal hernia       Inpatient Medications: Scheduled Meds:   aspirin EC  81 mg Oral Daily   atorvastatin  40 mg Oral Daily   enoxaparin (LOVENOX) injection  40 mg Subcutaneous Q24H   [START ON 08/15/2020] insulin aspart  0-15 Units Subcutaneous TID WC   insulin aspart  0-5 Units Subcutaneous QHS   pantoprazole  40 mg Oral Daily   Continuous Infusions:  PRN Meds: acetaminophen, nitroGLYCERIN, ondansetron (ZOFRAN) IV  Allergies:   No Known Allergies  Social History:   Social History   Socioeconomic History   Marital status: Married    Spouse name: Not on file   Number of children: Not on file   Years of education: Not on file   Highest education level: Not on file  Occupational History   Not on file  Tobacco Use   Smoking status: Never   Smokeless tobacco: Never  Substance and Sexual Activity   Alcohol use: No   Drug use: No   Sexual activity: Not on file  Other Topics Concern   Not on file  Social History Narrative   Married, originally from [place]. In the [place] since 2006 or 2007. One son to daughters. No caffeine no alcohol no tobacco. No drug use.   03/04/2016   Social Determinants of Health   Financial Resource Strain: Not on file  Food Insecurity: Not on file  Transportation Needs: Not on file  Physical Activity: Not on file  Stress: Not on file  Social Connections: Not on file  Intimate Partner Violence: Not on file    Family History:    Family History  Problem Relation Age of Onset   Colon cancer Neg Hx    Esophageal cancer Neg Hx      ROS:  Please see the history of present illness.   All other ROS reviewed and negative.     Physical Exam/Data:   Vitals:   08/14/20 1400 08/14/20 1500 08/14/20 1535 08/14/20 1739  BP: (!) 146/86 (!) 157/101 (!) 153/88 (!) 154/116  Pulse: 62 65 64 66  Resp: 12 16 18 19   Temp:    98.3 F (36.8 C)  TempSrc:    Oral  SpO2: 97% 99% 97% 100%  Weight:      Height:       No intake or output data in the 24 hours ending 08/14/20 1759 Last 3 Weights 08/14/2020 10/09/2019 07/14/2019   Weight (lbs) 220 lb 236 lb 230 lb 3.2 oz  Weight (kg) 99.791 kg 107.049 kg 104.418 kg     Body mass index is 30.68 kg/m.  General:  Well nourished, well developed, in no acute distress HEENT: normal Lymph: no adenopathy Neck: no JVD Endocrine:  No thryomegaly Vascular: No carotid bruits; FA pulses 2+ bilaterally without bruits  Cardiac:  normal S1, S2; RRR; no murmur  Lungs:  clear to auscultation bilaterally, no wheezing, rhonchi or rales  Abd: soft, nontender, no hepatomegaly  Ext: no edema Musculoskeletal:  No deformities, BUE and BLE strength normal and equal Skin: warm and dry  Neuro:  CNs 2-12 intact, no focal abnormalities noted Psych:  Normal affect   EKG:  The EKG was personally reviewed and demonstrates:  NSR with LAD, early repolarization pattern.  Telemetry:  Telemetry was personally reviewed and demonstrates:  NSR rate 64.  Relevant CV Studies: TTE procedure: CUS TTE SURFACE COMPLETE ECHO ADULT.  Procedure date  Date: 07/17/2017 Start: 09:12 AM  Technical Quality: Adequate visualization Study Location: Portable  Indications: Chest pain.  Patient Status: Routine  Height: 70 inches Weight: 218 pounds BSA: 2.17 m2 BMI: 31.28 kg/m2  BP: 132/70 mmHg  Conclusions  Summary  Normal left ventricular size and systolic function with no appreciable  segmental abnormality.  Ejection fraction is visually estimated at 65-70%.  LV diastolic function appears normal.  Normal right ventricle structure and function.  No significant valvular stenosis or regurgitation.  In comparison to the previous echo from 10/2011, there is no change.  Signature  ------------------------------------------------------------------------------   Electronically signed by 11/2011, DO, FACC(Interpreting physician) on   07/17/2017 05:37 PM   Laboratory Data:  High Sensitivity Troponin:   Recent Labs  Lab 08/14/20 1042 08/14/20 1236  TROPONINIHS 5 4     Chemistry Recent Labs  Lab  08/14/20 1042  NA 137  K 4.1  CL 98  CO2 29  GLUCOSE 132*  BUN 8  CREATININE 1.10  CALCIUM 9.1  GFRNONAA >60  ANIONGAP 10    No results for input(s): PROT, ALBUMIN, AST, ALT, ALKPHOS, BILITOT in the last 168 hours. Hematology Recent Labs  Lab 08/14/20 1042  WBC 5.6  RBC 5.50  HGB 16.8  HCT 50.4  MCV 91.6  MCH 30.5  MCHC 33.3  RDW 11.9  PLT 277   BNPNo results for input(s): BNP, PROBNP in the last 168 hours.  DDimer No results for input(s): DDIMER in the last 168 hours.   Radiology/Studies:  [facility] Chest Port 1 [address]  Result Date: 08/14/2020 CLINICAL DATA:  Chest pain. EXAM: PORTABLE CHEST 1 VIEW COMPARISON:  01/10/2019. FINDINGS: Mediastinum hilar structures normal. Cardiomegaly. No pulmonary venous congestion. No focal infiltrate. No pleural effusion or pneumothorax. IMPRESSION: Cardiomegaly. No pulmonary venous congestion. No acute pulmonary disease. Electronically Signed   By: Maisie Fus  Register   On: 08/14/2020 11:15     Assessment and Plan:   Chest pain. No evidence of MI. Patient does have risk factors for CAD including DM, HTN, HLD. I don't think there is any concern for pericarditis/effusion at this time based on exam, Ecg and CXR findings. I do think it is reasonable to pursue ischemic evaluation. Discussed options including stress testing, Coronary CTA or cardiac cath. I would recommend Coronary CTA since this will also allow evaluation of pericardium and aorta. Will plan on doing this in am post beta blocker load.  HTN poorly controlled. Would increase valsartan to 320 mg daily. May need additional therapy and potentially a calcium channel blocker may be a good option HLD. Check lipid panel. If CAD noted on CT will need statin therapy DM check A1c. Per primary team Remote pericarditis s/p pericardiocentesis in 2013. No recurrence.    Risk Assessment/Risk Scores:     HEAR Score (for undifferentiated chest pain):  HEAR Score: 5          For questions or  updates, please contact [facility] Please consult [email or web address] for contact info under    Signed, Demetruis Depaul [place], MD  08/14/2020 5:59 PM

## 2020-08-14 NOTE — ED Triage Notes (Signed)
Patient complaining of pain and pressure in the middle of chest. Started today. Endorses some shortness of breath.

## 2020-08-14 NOTE — Progress Notes (Signed)
Received a phone call from Facility: [facility]  Requesting MD: Dr. Ralene Bathe Patient with h/o DM2, HTN, GERD  presenting with chest pain, worse with exertion. Eases with rest.  Vitals stable. Hx of pericardial effusion in 2013 that required drainage and grew out MAC. No issues since that time. He has had 2 negative troponins, ekg with no ST elevation. Dr. [place] with cardiology called and asked that he be admitted for chest pain rule out.   The patient will be accepted for admission to telemetry at [facility] when bed is available.  -would call cardiology, EDP did call them as well.  -needs echo.  -medical tele    Nursing staff, Please call the [place] number at the top of Amion at the time of the patient's arrival so that the patient can be paged to the admitting physician.   Casimer Bilis , M.D. [facility]

## 2020-08-14 NOTE — ED Notes (Signed)
[facility] at bedside for transfer of pt to [facility].  Pt stable for transfer

## 2020-08-14 NOTE — H&P (Signed)
History and Physical    Trevor Lewis AQT:[id number] DOB: 1980/05/02 DOA: 08/14/2020  PCP: Sharlene Dory, DO  Patient coming from: Home  I have personally briefly reviewed patient's old medical records in [facility]  Chief Complaint: Chest pain  HPI: Trevor Lewis is a 40 y.o. male with medical history significant of diabetes, hyperlipidemia, hypertension, presents to the hospital with complaints of left-sided chest discomfort.  He did note that his blood pressure was elevated at that time.  He felt the symptoms got worse on exertion, improved with rest.  He did have some associated shortness of breath.  Reported that his discomfort did radiate to his left shoulder.  He is chronically on statin, but has not been taking this regularly.  Previous history of pericardial effusion where he underwent pericardiocentesis.  He was also treated for MAI by infectious disease at [facility] for positive sputum culture in 2013.  ED Course: Evaluated in the emergency room where troponins were negative and EKG unremarkable.  Due to his risk factors, he was referred for admission for further work-up  Review of Systems:  Review of Systems  Constitutional:  Negative for chills and fever.  HENT:  Negative for congestion and sore throat.   Eyes:  Negative for blurred vision and double vision.  Respiratory:  Positive for shortness of breath. Negative for cough.   Cardiovascular:  Positive for chest pain. Negative for palpitations and leg swelling.  Gastrointestinal:  Positive for nausea. Negative for abdominal pain, diarrhea and vomiting.  Genitourinary:  Negative for dysuria.  Musculoskeletal:  Positive for joint pain.  Neurological:  Negative for dizziness and weakness.     Past Medical History:  Diagnosis Date   Carpal tunnel syndrome 02/17/2016   GERD (gastroesophageal reflux disease) 02/17/2016   Hypertension    Pericardial effusion    TB lung, latent     Past Surgical History:   Procedure Laterality Date   COLONOSCOPY  2013?   PERICARDIOCENTESIS     UPPER GASTROINTESTINAL ENDOSCOPY  2013   hiatal hernia    Social History:  reports that he has never smoked. He has never used smokeless tobacco. He reports that he does not drink alcohol and does not use drugs.  No Known Allergies  Family History  Problem Relation Age of Onset   Colon cancer Neg Hx    Esophageal cancer Neg Hx      Prior to Admission medications   Medication Sig Start Date End Date Taking? Authorizing Provider  albuterol (VENTOLIN HFA) 108 (90 Base) MCG/ACT inhaler Inhale 2 puffs into the lungs every 6 (six) hours as needed for wheezing or shortness of breath. 08/12/20   Sharlene Dory, DO  atorvastatin (LIPITOR) 40 MG tablet Take 1 tablet (40 mg total) by mouth daily. 12/27/18   Sharlene Dory, DO  clarithromycin (BIAXIN) 250 MG tablet Take 1 tablet (250 mg total) by mouth 2 (two) times daily. 10/27/19   Wendling, Jilda Roche, DO  clarithromycin (BIAXIN) 500 MG tablet Take 1 tablet (500 mg total) by mouth 2 (two) times daily. 10/27/19   Sharlene Dory, DO  lansoprazole (PREVACID) 30 MG capsule Take 1 capsule (30 mg total) by mouth 2 (two) times daily before a meal. 11/23/19   Wendling, Jilda Roche, DO  meloxicam (MOBIC) 15 MG tablet TAKE 1 TABLET BY MOUTH EVERY DAY 11/17/19   Carmelia Roller, Jilda Roche, DO  metFORMIN (GLUCOPHAGE) 500 MG tablet TAKE 1 TABLET (500 MG TOTAL) BY MOUTH 2 (TWO) TIMES DAILY  WITH A MEAL. 07/30/20   Sharlene Dory, DO  valsartan (DIOVAN) 160 MG tablet TAKE 1 TABLET BY MOUTH EVERY DAY 05/02/20   Sharlene Dory, DO    Physical Exam: Vitals:   08/14/20 1739 08/14/20 1803 08/14/20 1957 08/14/20 2004  BP: (!) 154/116 (!) 157/116 (!) 154/97 140/85  Pulse: 66 64 63 60  Resp: 19  18 14   Temp: 98.3 F (36.8 C)  98 F (36.7 C) 98.5 F (36.9 C)  TempSrc: Oral  Oral Oral  SpO2: 100%  98% 96%  Weight:      Height:         Constitutional: NAD, calm, comfortable Eyes: PERRL, lids and conjunctivae normal ENMT: Mucous membranes are moist. Posterior pharynx clear of any exudate or lesions.Normal dentition.  Neck: normal, supple, no masses, no thyromegaly Respiratory: clear to auscultation bilaterally, no wheezing, no crackles. Normal respiratory effort. No accessory muscle use.  Reports some reproducible tenderness in left chest on palpation Cardiovascular: Regular rate and rhythm, no murmurs / rubs / gallops. No extremity edema. 2+ pedal pulses. No carotid bruits.  Abdomen: no tenderness, no masses palpated. No hepatosplenomegaly. Bowel sounds positive.  Musculoskeletal: no clubbing / cyanosis. No joint deformity upper and lower extremities. Good ROM, no contractures. Normal muscle tone.  Skin: no rashes, lesions, ulcers. No induration Neurologic: CN 2-12 grossly intact. Sensation intact, DTR normal. Strength 5/5 in all 4.  Psychiatric: Normal judgment and insight. Alert and oriented x 3. Normal mood.    Labs on Admission: I have personally reviewed following labs and imaging studies  CBC: Recent Labs  Lab 08/14/20 1042 08/14/20 1816  WBC 5.6 5.1  HGB 16.8 17.1*  HCT 50.4 51.3  MCV 91.6 92.6  PLT 277 286   Basic Metabolic Panel: Recent Labs  Lab 08/14/20 1042 08/14/20 1816  NA 137  --   K 4.1  --   CL 98  --   CO2 29  --   GLUCOSE 132*  --   BUN 8  --   CREATININE 1.10 0.95  CALCIUM 9.1  --    GFR: Estimated Creatinine Clearance: 121.9 mL/min (by C-G formula based on SCr of 0.95 mg/dL). Liver Function Tests: No results for input(s): AST, ALT, ALKPHOS, BILITOT, PROT, ALBUMIN in the last 168 hours. No results for input(s): LIPASE, AMYLASE in the last 168 hours. No results for input(s): AMMONIA in the last 168 hours. Coagulation Profile: No results for input(s): INR, PROTIME in the last 168 hours. Cardiac Enzymes: No results for input(s): CKTOTAL, CKMB, CKMBINDEX, TROPONINI in the last  168 hours. BNP (last 3 results) No results for input(s): PROBNP in the last 8760 hours. HbA1C: Recent Labs    08/14/20 1816  HGBA1C 6.6*   CBG: Recent Labs  Lab 08/14/20 1741 08/14/20 2048  GLUCAP 109* 140*   Lipid Profile: No results for input(s): CHOL, HDL, LDLCALC, TRIG, CHOLHDL, LDLDIRECT in the last 72 hours. Thyroid Function Tests: No results for input(s): TSH, T4TOTAL, FREET4, T3FREE, THYROIDAB in the last 72 hours. Anemia Panel: No results for input(s): VITAMINB12, FOLATE, FERRITIN, TIBC, IRON, RETICCTPCT in the last 72 hours. Urine analysis:    Component Value Date/Time   COLORURINE YELLOW 01/10/2019 1401   APPEARANCEUR CLEAR 01/10/2019 1401   LABSPEC <=1.005 (A) 01/10/2019 1401   PHURINE 6.0 01/10/2019 1401   GLUCOSEU NEGATIVE 01/10/2019 1401   HGBUR LARGE (A) 01/10/2019 1401   BILIRUBINUR NEGATIVE 01/10/2019 1401   KETONESUR NEGATIVE 01/10/2019 1401   PROTEINUR NEGATIVE 10/10/2011  1517   UROBILINOGEN 4.0 (A) 01/10/2019 1401   NITRITE NEGATIVE 01/10/2019 1401   LEUKOCYTESUR NEGATIVE 01/10/2019 1401    Radiological Exams on Admission: DG Shoulder Right  Result Date: 08/14/2020 CLINICAL DATA:  Right shoulder pain.  No known injury. EXAM: RIGHT SHOULDER - 2+ VIEW COMPARISON:  None. FINDINGS: There is no evidence of fracture or dislocation. Normal joint spaces and alignment. There is no evidence of arthropathy or other focal bone abnormality. Soft tissues are unremarkable. IMPRESSION: Negative radiographs of the right shoulder. Electronically Signed   By: Narda Rutherford M.D.   On: 08/14/2020 20:53   DG Chest Port 1 View  Result Date: 08/14/2020 CLINICAL DATA:  Chest pain. EXAM: PORTABLE CHEST 1 VIEW COMPARISON:  01/10/2019. FINDINGS: Mediastinum hilar structures normal. Cardiomegaly. No pulmonary venous congestion. No focal infiltrate. No pleural effusion or pneumothorax. IMPRESSION: Cardiomegaly. No pulmonary venous congestion. No acute pulmonary disease.  Electronically Signed   By: Maisie Fus  Register   On: 08/14/2020 11:15    EKG: Independently reviewed.  No acute ST/T changes  Assessment/Plan Active Problems:   Primary hypertension   Chest pain, precordial   Hyperlipidemia     Chest pain -Work-up thus far has been negative -Seen by cardiology -Due to significant risk factors, plan will be for coronary CTA in a.m. -Further work-up based on these results -Continue on aspirin  Hyperlipidemia -Restarted on statin -Check lipid panel in a.m.  Hypertension -Continue on ARB -He is also been started on Coreg  Diabetes -Uses metformin intermittently at home -A1c 6.6 -We will keep on sliding scale insulin while in the hospital  Right shoulder pain -Describes pain in his right shoulder for the last few months, worse in anterior position -He does lift heavy furniture for a living -Suspect he may have some ligament injury -Checking x-ray right shoulder to rule out any bony abnormalities -If negative, would anticipate referral to sports medicine for further work-up/treatment  DVT prophylaxis: Lovenox Code Status: Full code Family Communication: Discussed with wife at the bedside Disposition Plan: Anticipate discharge home once cardiac work-up is complete Consults called: Cardiology Admission status: Observation, telemetry  Erick Blinks MD [facility]   If 7PM-7AM, please contact night-coverage [email or web address]   08/14/2020, 9:03 PM

## 2020-08-14 NOTE — ED Provider Notes (Signed)
Care assumed at 1400. Patient here for evaluation of exertional chest pain. Initial plan for transfer to [facility] per patient preference. There are no current beds available at [facility]. On repeat discussion patient is in agreement with admission to [facility]. Discussed with Dr. [place] with cardiology, recommends medicine admission to [facility] Medicine consulted for admission.   Tilden Fossa, MD 08/14/20 1537

## 2020-08-15 ENCOUNTER — Observation Stay (HOSPITAL_COMMUNITY): Payer: 59

## 2020-08-15 ENCOUNTER — Observation Stay (HOSPITAL_BASED_OUTPATIENT_CLINIC_OR_DEPARTMENT_OTHER): Payer: 59

## 2020-08-15 DIAGNOSIS — E1165 Type 2 diabetes mellitus with hyperglycemia: Secondary | ICD-10-CM | POA: Diagnosis not present

## 2020-08-15 DIAGNOSIS — I10 Essential (primary) hypertension: Secondary | ICD-10-CM | POA: Diagnosis not present

## 2020-08-15 DIAGNOSIS — E782 Mixed hyperlipidemia: Secondary | ICD-10-CM

## 2020-08-15 DIAGNOSIS — R079 Chest pain, unspecified: Secondary | ICD-10-CM

## 2020-08-15 DIAGNOSIS — R072 Precordial pain: Secondary | ICD-10-CM | POA: Diagnosis not present

## 2020-08-15 LAB — LIPID PANEL
Cholesterol: 202 mg/dL — ABNORMAL HIGH (ref 0–200)
HDL: 36 mg/dL — ABNORMAL LOW (ref >40)
LDL Cholesterol: 125 mg/dL — ABNORMAL HIGH (ref 0–99)
Total CHOL/HDL Ratio: 5.6 RATIO
Triglycerides: 206 mg/dL — ABNORMAL HIGH (ref <150)
VLDL: 41 mg/dL — ABNORMAL HIGH (ref 0–40)

## 2020-08-15 LAB — GLUCOSE, CAPILLARY
Glucose-Capillary: 134 mg/dL — ABNORMAL HIGH (ref 70–99)
Glucose-Capillary: 113 mg/dL — ABNORMAL HIGH (ref 70–99)
Glucose-Capillary: 101 mg/dL — ABNORMAL HIGH (ref 70–99)

## 2020-08-15 LAB — ECHOCARDIOGRAM COMPLETE
Area-P 1/2: 4.21 cm2
Height: 71 in
S' Lateral: 3.15 cm
Weight: 3729.6 oz

## 2020-08-15 MED ORDER — IOHEXOL 350 MG/ML SOLN
100.0000 mL | Freq: Once | INTRAVENOUS | Status: AC | PRN
  Administered 2020-08-15: 100 mL via INTRAVENOUS

## 2020-08-15 MED ORDER — VALSARTAN 160 MG PO TABS: 320.0000 mg | ORAL_TABLET | Freq: Every day | ORAL | 0 refills | Status: DC

## 2020-08-15 MED ORDER — METHOCARBAMOL 500 MG PO TABS: 500.0000 mg | ORAL_TABLET | Freq: Three times a day (TID) | ORAL | 0 refills | Status: AC | PRN

## 2020-08-15 MED ORDER — IRBESARTAN 300 MG PO TABS
300.0000 mg | ORAL_TABLET | Freq: Every day | ORAL | Status: DC
  Administered 2020-08-15: 300 mg via ORAL
  Filled 2020-08-15: qty 1

## 2020-08-15 MED ORDER — METOPROLOL TARTRATE 5 MG/5ML IV SOLN
INTRAVENOUS | Status: AC
  Administered 2020-08-15: 5 mg
  Filled 2020-08-15: qty 5

## 2020-08-15 MED ORDER — ALBUTEROL SULFATE HFA 108 (90 BASE) MCG/ACT IN AERS: 2.0000 | INHALATION_SPRAY | Freq: Four times a day (QID) | RESPIRATORY_TRACT | 0 refills | Status: DC | PRN

## 2020-08-15 MED ORDER — CARVEDILOL 3.125 MG PO TABS: 3.1250 mg | ORAL_TABLET | Freq: Two times a day (BID) | ORAL | 0 refills | Status: DC

## 2020-08-15 MED ORDER — ATORVASTATIN CALCIUM 40 MG PO TABS: 40.0000 mg | ORAL_TABLET | Freq: Every day | ORAL | 0 refills | Status: DC

## 2020-08-15 MED ORDER — LANSOPRAZOLE 30 MG PO CPDR: 30.0000 mg | DELAYED_RELEASE_CAPSULE | Freq: Two times a day (BID) | ORAL | 0 refills | Status: DC

## 2020-08-15 MED ORDER — NITROGLYCERIN 0.4 MG SL SUBL
SUBLINGUAL_TABLET | SUBLINGUAL | Status: AC
  Administered 2020-08-15: 0.8 mg
  Filled 2020-08-15: qty 2

## 2020-08-15 NOTE — Discharge Summary (Signed)
Discharge Summary  Shanon Becvar TGG:[id number] DOB: Jun 23, 1980  PCP: Sharlene Dory, DO  Admit date: 08/14/2020 Discharge date: 08/15/2020  Time spent: 30 mins  Recommendations for Outpatient Follow-up:  Follow-up with PCP Encourage medication compliance  Discharge Diagnoses:  Active Hospital Problems   Diagnosis Date Noted   Chest pain, precordial 08/14/2020   Hyperlipidemia    Type 2 diabetes mellitus with hyperglycemia, without long-term current use of insulin (HCC) 12/27/2018   Primary hypertension 03/25/2016   GERD (gastroesophageal reflux disease) 02/17/2016    Resolved Hospital Problems  No resolved problems to display.    Discharge Condition: Stable  Diet recommendation: Heart healthy, moderate carb  Vitals:   08/15/20 1557 08/15/20 1705  BP: (!) 140/102   Pulse: (!) 59 65  Resp: 20   Temp: 98.1 F (36.7 C)   SpO2: 99%     History of present illness:  Trevor Lewis is a 40 y.o. male with medical history significant of diabetes, hyperlipidemia, hypertension, presents to the hospital with complaints of left-sided chest discomfort.  He did note that his blood pressure was elevated at that time.  He felt the symptoms got worse on exertion, improved with rest.  He did have some associated shortness of breath.  Reported that his discomfort did radiate to his left shoulder.  He is chronically on statin, but has not been taking this regularly.  Previous history of pericardial effusion where he underwent pericardiocentesis.  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  In the ED, troponins were negative, EKG unremarkable.  Cardiology consulted.  Patient admitted for further management.    Today, patient denies any left-sided chest discomfort, reports feeling better, denies any worsening shortness of breath, nausea/vomiting, fever/chills.  Patient still with right shoulder tenderness, likely musculoskeletal  relating to lifting heavy objects.  Patient stable to be discharged as cardiac work-up remained negative.  Patient advised to be compliant with his medications and follow-up with his PCP for further management   Hospital Course:  Active Problems:   GERD (gastroesophageal reflux disease)   Primary hypertension   Type 2 diabetes mellitus with hyperglycemia, without long-term current use of insulin (HCC)   Chest pain, precordial   Hyperlipidemia   Likely atypical chest pain Possibly musculoskeletal Trop negative, EKG with no acute ST changes Echo with EF of 60 to 65%, no regional wall motion abnormality, grade 1 diastolic dysfunction Cardiology consulted, recommended coronary CT, which shows minimal nonobstructive CAD Continue Lipitor, Coreg Follow-up with PCP for further management  Hyperlipidemia LDL 125 Continue Lipitor   Hypertension Uncontrolled Increased valsartan to 320 mg daily, started Coreg Follow-up with PCP   Diabetes mellitus type II Last A1c 6.6 Continue PTA metformin Follow-up with PCP   Right shoulder pain Likely musculoskeletal from lifting heavy furniture X-ray right shoulder unremarkable Pain management with Robaxin as needed Follow-up with PCP for further management if persistent    Estimated body mass index is 32.51 kg/m as calculated from the following:   Height as of this encounter: 5\' 11"  (1.803 m).   Weight as of this encounter: 105.7 kg.    Procedures: None  Consultations: Cardiology  Discharge Exam: BP (!) 140/102 (BP Location: Left Arm)   Pulse 65   Temp 98.1 F (36.7 C) (Oral)   Resp 20   Ht 5\' 11"  (1.803 m)   Wt 105.7 kg   SpO2 99%   BMI 32.51 kg/m   General: NAD  Cardiovascular:  S1, S2 present Respiratory: CTAB Abdomen: Soft, nontender, nondistended, bowel sounds present Musculoskeletal: No bilateral pedal edema noted Skin: Normal Psychiatry: Normal mood   Discharge Instructions You were cared for by a hospitalist  during your hospital stay. If you have any questions about your discharge medications or the care you received while you were in the hospital after you are discharged, you can call the unit and asked to speak with the hospitalist on call if the hospitalist that took care of you is not available. Once you are discharged, your primary care physician will handle any further medical issues. Please note that NO REFILLS for any discharge medications will be authorized once you are discharged, as it is imperative that you return to your primary care physician (or establish a relationship with a primary care physician if you do not have one) for your aftercare needs so that they can reassess your need for medications and monitor your lab values.  Discharge Instructions     Diet - low sodium heart healthy   Complete by: As directed    Increase activity slowly   Complete by: As directed       Allergies as of 08/15/2020   No Known Allergies      Medication List     STOP taking these medications    ibuprofen 200 MG tablet Commonly known as: ADVIL   meloxicam 15 MG tablet Commonly known as: MOBIC       TAKE these medications    acetaminophen 500 MG tablet Commonly known as: TYLENOL Take 1,000 mg by mouth every 6 (six) hours as needed for mild pain.   albuterol 108 (90 Base) MCG/ACT inhaler Commonly known as: VENTOLIN HFA Inhale 2 puffs into the lungs every 6 (six) hours as needed for wheezing or shortness of breath.   atorvastatin 40 MG tablet Commonly known as: LIPITOR Take 1 tablet (40 mg total) by mouth daily.   calcium carbonate 750 MG chewable tablet Commonly known as: TUMS EX Chew 1 tablet by mouth daily as needed for heartburn.   carvedilol 3.125 MG tablet Commonly known as: COREG Take 1 tablet (3.125 mg total) by mouth 2 (two) times daily with a meal.   lansoprazole 30 MG capsule Commonly known as: PREVACID Take 1 capsule (30 mg total) by mouth 2 (two) times daily  before a meal.   metFORMIN 500 MG tablet Commonly known as: GLUCOPHAGE TAKE 1 TABLET (500 MG TOTAL) BY MOUTH 2 (TWO) TIMES DAILY WITH A MEAL.   methocarbamol 500 MG tablet Commonly known as: Robaxin Take 1 tablet (500 mg total) by mouth every 8 (eight) hours as needed for muscle spasms.   valsartan 160 MG tablet Commonly known as: DIOVAN Take 2 tablets (320 mg total) by mouth daily. What changed: how much to take       No Known Allergies  Follow-up Information     Sharlene Dory, DO. Schedule an appointment as soon as possible for a visit in 1 week(s).   Specialty: Family Medicine Why: GO: JULY 27 AT 11:15AM Contact information: [address] [place] [address] [phone]                  The results of significant diagnostics from this hospitalization (including imaging, microbiology, ancillary and laboratory) are listed below for reference.    Significant Diagnostic Studies: DG Shoulder Right  Result Date: 08/14/2020 CLINICAL DATA:  Right shoulder pain.  No known injury. EXAM: RIGHT SHOULDER -  2+ VIEW COMPARISON:  None. FINDINGS: There is no evidence of fracture or dislocation. Normal joint spaces and alignment. There is no evidence of arthropathy or other focal bone abnormality. Soft tissues are unremarkable. IMPRESSION: Negative radiographs of the right shoulder. Electronically Signed   By: Narda Rutherford M.D.   On: 08/14/2020 20:53   CT CORONARY MORPH W/CTA COR W/SCORE W/CA W/CM &/OR WO/CM  Addendum Date: 08/15/2020   ADDENDUM REPORT: 08/15/2020 14:03 EXAM: OVER-READ INTERPRETATION  CT CHEST The following report is an over-read performed by radiologist Dr. [facility] on 08/15/2020. This over-read does not include interpretation of cardiac or coronary anatomy or pathology. The coronary calcium score and cardiac CTA interpretation by the cardiologist is attached. COMPARISON:  None. FINDINGS: Within the  visualized portions of the thorax there are no suspicious appearing pulmonary nodules or masses, there is no acute consolidative airspace disease, no pleural effusions, no pneumothorax and no lymphadenopathy. Visualized portions of the upper abdomen are unremarkable. There are no aggressive appearing lytic or blastic lesions noted in the visualized portions of the skeleton. IMPRESSION: 1. No significant incidental noncardiac findings are noted. Electronically Signed   By: Trudie Reed M.D.   On: 08/15/2020 14:03   Result Date: 08/15/2020 HISTORY: Chest pain, nonspecific EXAM: Cardiac/Coronary CT TECHNIQUE: The patient was scanned on a [employer]. PROTOCOL: A 100 kV prospective scan was triggered in the descending thoracic aorta at 111 HU's. Axial non-contrast 3 mm slices were carried out through the heart. The data set was analyzed on a dedicated work station and scored using the Agatson method. Gantry rotation speed was 250 msecs and collimation was 0.6 mm. Heart rate optimized medically, and 0.8 mg of sublingual nitroglycerin was given. The 3D data set was reconstructed in 5% intervals of 35-75% of the R-R cycle. Diastolic phases were analyzed on a dedicated work station using MPR, MIP and VRT modes. The patient received OMNIPAQUE IOHEXOL 350 MG/ML SOLN of contrast. FINDINGS: Coronary calcium score: The patient's coronary artery calcium score is 9. Coronary arteries: Normal coronary origins.  Right dominance. Right Coronary Artery: Normal caliber vessel, gives rise to PDA. Scattered noncalcified plaque and one area of mixed calcified and noncalcified plaque in the proximal RCA with 1-24% stenosis. Left Main Coronary Artery: Normal caliber vessel, short length and acute takeoff. No significant plaque or stenosis. Left Anterior Descending Coronary Artery: Normal caliber vessel. Scattered noncalcified plaque and one trivial area of calcification in mLAD, without significant stenosis. Gives rise  to 4 diagonal branches. Left Circumflex Artery: Normal caliber vessel. No significant plaque or stenosis. Gives rise to 3 OM branches. Aorta: Normal size, 36 mm mm at the mid ascending aorta (level of the PA bifurcation) measured double oblique. No calcifications. No dissection. Aortic Valve: No calcifications. Trileaflet. Other findings: Normal pulmonary vein drainage into the left atrium. Common left ostium (normal variant) Normal left atrial appendage without a thrombus. There is a small vessel anterior to the LAA, appears venous in origin. Image limitations prevent determination of whether vessel connects to LAA or is simply in close proximity, but likely no clinical significance. Normal size of the pulmonary artery. Normal appearing pericardium. IMPRESSION: 1.  Minimal nonobstructive CAD, CADRADS = 1 2. Coronary calcium score of 9. His age is below the typical percentile for calcium score, but using an age of 63, he would be 87th percentile. 3. Normal coronary origin with right dominance. 4.  Pericardium is normal in appearance. INTERPRETATION: 1. CAD-RADS 0: No evidence  of CAD (0%). Consider non-atherosclerotic causes of chest pain. 2. CAD-RADS 1: Minimal non-obstructive CAD (0-24%). Consider non-atherosclerotic causes of chest pain. Consider preventive therapy and risk factor modification. 3. CAD-RADS 2: Mild non-obstructive CAD (25-49%). Consider non-atherosclerotic causes of chest pain. Consider preventive therapy and risk factor modification. 4. CAD-RADS 3: Moderate stenosis (50-69%). Consider symptom-guided anti-ischemic pharmacotherapy as well as risk factor modification per guideline directed care. Additional analysis with CT FFR will be submitted. 5. CAD-RADS 4: Severe stenosis. (70-99% or > 50% left main). Cardiac catheterization or CT FFR is recommended. Consider symptom-guided anti-ischemic pharmacotherapy as well as risk factor modification per guideline directed care. Invasive coronary angiography  recommended with revascularization per published guideline statements. 6. CAD-RADS 5: Total coronary occlusion (100%). Consider cardiac catheterization or viability assessment. Consider symptom-guided anti-ischemic pharmacotherapy as well as risk factor modification per guideline directed care. 7. CAD-RADS N: Non-diagnostic study. Obstructive CAD can't be excluded. Alternative evaluation is recommended. Electronically Signed: By: Jodelle Red M.D. On: 08/15/2020 12:14   DG Chest Port 1 View  Result Date: 08/14/2020 CLINICAL DATA:  Chest pain. EXAM: PORTABLE CHEST 1 VIEW COMPARISON:  01/10/2019. FINDINGS: Mediastinum hilar structures normal. Cardiomegaly. No pulmonary venous congestion. No focal infiltrate. No pleural effusion or pneumothorax. IMPRESSION: Cardiomegaly. No pulmonary venous congestion. No acute pulmonary disease. Electronically Signed   By: Maisie Fus  Register   On: 08/14/2020 11:15   ECHOCARDIOGRAM COMPLETE  Result Date: 08/15/2020    ECHOCARDIOGRAM REPORT   Patient Name:   Trevor Lewis Date of Exam: 08/15/2020 Medical Rec #:  [id number]      Height:       71.0 in Accession #:    [id number]     Weight:       233.1 lb Date of Birth:  1980-03-09       BSA:          2.250 m Patient Age:    40 years       BP:           146/100 mmHg Patient Gender: M              HR:           61 bpm. Exam Location:  Inpatient Procedure: 2D Echo, Cardiac Doppler and Color Doppler Indications:    R07.9* Chest pain, unspecified  History:        Patient has no prior history of Echocardiogram examinations.                 Risk Factors:Hypertension. GERD. Pericardial Effusion.  Sonographer:    Elmarie Shiley Dance Referring Phys: [id number] JEHANZEB MEMON IMPRESSIONS  1. Left ventricular ejection fraction, by estimation, is 60 to 65%. Left ventricular ejection fraction by 3D volume is 58 %. The left ventricle has normal function. The left ventricle has no regional wall motion abnormalities. There is moderate concentric left  ventricular hypertrophy. Left ventricular diastolic parameters are consistent with Grade I diastolic dysfunction (impaired relaxation).  2. Right ventricular systolic function is normal. The right ventricular size is normal. Tricuspid regurgitation signal is inadequate for assessing PA pressure.  3. The mitral valve is normal in structure. No evidence of mitral valve regurgitation. No evidence of mitral stenosis.  4. The aortic valve is tricuspid. Aortic valve regurgitation is not visualized. No aortic stenosis is present.  5. The inferior vena cava is normal in size with greater than 50% respiratory variability, suggesting right atrial pressure of 3 mmHg. Comparison(s): No prior Echocardiogram. FINDINGS  Left Ventricle: Left ventricular  ejection fraction, by estimation, is 60 to 65%. Left ventricular ejection fraction by 3D volume is 58 %. The left ventricle has normal function. The left ventricle has no regional wall motion abnormalities. The left ventricular internal cavity size was normal in size. There is moderate concentric left ventricular hypertrophy. Left ventricular diastolic parameters are consistent with Grade I diastolic dysfunction (impaired relaxation). Right Ventricle: The right ventricular size is normal. No increase in right ventricular wall thickness. Right ventricular systolic function is normal. Tricuspid regurgitation signal is inadequate for assessing PA pressure. Left Atrium: Left atrial size was normal in size. Right Atrium: Right atrial size was normal in size. Pericardium: There is no evidence of pericardial effusion. Mitral Valve: The mitral valve is normal in structure. No evidence of mitral valve regurgitation. No evidence of mitral valve stenosis. Tricuspid Valve: The tricuspid valve is normal in structure. Tricuspid valve regurgitation is not demonstrated. No evidence of tricuspid stenosis. Aortic Valve: The aortic valve is tricuspid. Aortic valve regurgitation is not visualized. No  aortic stenosis is present. Pulmonic Valve: The pulmonic valve was normal in structure. Pulmonic valve regurgitation is not visualized. No evidence of pulmonic stenosis. Aorta: The aortic root and ascending aorta are structurally normal, with no evidence of dilitation when indexed to age and BSA. Venous: The inferior vena cava is normal in size with greater than 50% respiratory variability, suggesting right atrial pressure of 3 mmHg. IAS/Shunts: The atrial septum is grossly normal.  LEFT VENTRICLE PLAX 2D LVIDd:         3.90 cm         Diastology LVIDs:         3.15 cm         LV e' medial:    7.18 cm/s LV PW:         1.70 cm         LV E/e' medial:  9.8 LV IVS:        1.50 cm         LV e' lateral:   6.53 cm/s LVOT diam:     1.90 cm         LV E/e' lateral: 10.8 LV SV:         48 LV SV Index:   21 LVOT Area:     2.84 cm        3D Volume EF                                LV 3D EF:    Left                                             ventricular                                             ejection                                             fraction by  3D volume                                             is 58 %.                                 3D Volume EF:                                3D EF:        58 % RIGHT VENTRICLE             IVC RV Basal diam:  2.90 cm     IVC diam: 2.10 cm RV S prime:     11.20 cm/s TAPSE (M-mode): 1.8 cm LEFT ATRIUM             Index       RIGHT ATRIUM           Index LA diam:        3.60 cm 1.60 cm/m  RA Area:     12.60 cm LA Vol (A2C):   30.9 ml 13.73 ml/m RA Volume:   27.20 ml  12.09 ml/m LA Vol (A4C):   23.6 ml 10.49 ml/m LA Biplane Vol: 27.3 ml 12.13 ml/m  AORTIC VALVE LVOT Vmax:   85.00 cm/s LVOT Vmean:  59.000 cm/s LVOT VTI:    0.169 m  AORTA Ao Root diam: 3.30 cm Ao Asc diam:  3.80 cm MITRAL VALVE MV Area (PHT): 4.21 cm    SHUNTS MV Decel Time: 180 msec    Systemic VTI:  0.17 m MV E velocity: 70.30 cm/s  Systemic Diam: 1.90 cm MV  A velocity: 57.80 cm/s MV E/A ratio:  1.22 Riley LamMahesh Chandrasekhar MD Electronically signed by Riley LamMahesh Chandrasekhar MD Signature Date/Time: 08/15/2020/1:57:20 PM    Final     Microbiology: Recent Results (from the past 240 hour(s))  Resp Panel by RT-PCR (Flu A&B, Covid) Nasopharyngeal Swab     Status: None   Collection Time: 08/14/20  3:09 PM   Specimen: Nasopharyngeal Swab; Nasopharyngeal(NP) swabs in vial transport medium  Result Value Ref Range Status   SARS Coronavirus 2 by RT PCR NEGATIVE NEGATIVE Final    Comment: (NOTE) SARS-CoV-2 target nucleic acids are NOT DETECTED.  The SARS-CoV-2 RNA is generally detectable in upper respiratory specimens during the acute phase of infection. The lowest concentration of SARS-CoV-2 viral copies this assay can detect is 138 copies/mL. A negative result does not preclude SARS-Cov-2 infection and should not be used as the sole basis for treatment or other patient management decisions. A negative result may occur with  improper specimen collection/handling, submission of specimen other than nasopharyngeal swab, presence of viral mutation(s) within the areas targeted by this assay, and inadequate number of viral copies(<138 copies/mL). A negative result must be combined with clinical observations, patient history, and epidemiological information. The expected result is Negative.  Fact Sheet for Patients:  BloggerCourse.comhttps://www.fda.gov/media/152166/download  Fact Sheet for Healthcare Providers:  SeriousBroker.ithttps://www.fda.gov/media/152162/download  This test is no t yet approved or cleared by the [place] FDA and  has been authorized for detection and/or diagnosis of SARS-CoV-2 by FDA under an Emergency Use Authorization (EUA). This EUA will remain  in effect (meaning this test can be  used) for the duration of the COVID-19 declaration under Section 564(b)(1) of the Act, 21 U.S.C.section 360bbb-3(b)(1), unless the authorization is terminated  or revoked sooner.        Influenza A by PCR NEGATIVE NEGATIVE Final   Influenza B by PCR NEGATIVE NEGATIVE Final    Comment: (NOTE) The Xpert Xpress SARS-CoV-2/FLU/RSV plus assay is intended as an aid in the diagnosis of influenza from Nasopharyngeal swab specimens and should not be used as a sole basis for treatment. Nasal washings and aspirates are unacceptable for Xpert Xpress SARS-CoV-2/FLU/RSV testing.  Fact Sheet for Patients: BloggerCourse.com  Fact Sheet for Healthcare Providers: SeriousBroker.it  This test is not yet approved or cleared by the [place] FDA and has been authorized for detection and/or diagnosis of SARS-CoV-2 by FDA under an Emergency Use Authorization (EUA). This EUA will remain in effect (meaning this test can be used) for the duration of the COVID-19 declaration under Section 564(b)(1) of the Act, 21 U.S.C. section 360bbb-3(b)(1), unless the authorization is terminated or revoked.  Performed at [facility], [address] [place], [place] [address]      Labs: Basic Metabolic Panel: Recent Labs  Lab 08/14/20 1042 08/14/20 1816  NA 137  --   K 4.1  --   CL 98  --   CO2 29  --   GLUCOSE 132*  --   BUN 8  --   CREATININE 1.10 0.95  CALCIUM 9.1  --    Liver Function Tests: No results for input(s): AST, ALT, ALKPHOS, BILITOT, PROT, ALBUMIN in the last 168 hours. No results for input(s): LIPASE, AMYLASE in the last 168 hours. No results for input(s): AMMONIA in the last 168 hours. CBC: Recent Labs  Lab 08/14/20 1042 08/14/20 1816  WBC 5.6 5.1  HGB 16.8 17.1*  HCT 50.4 51.3  MCV 91.6 92.6  PLT 277 286   Cardiac Enzymes: No results for input(s): CKTOTAL, CKMB, CKMBINDEX, TROPONINI in the last 168 hours. BNP: BNP (last 3 results) No results for input(s): BNP in the last 8760 hours.  ProBNP (last 3 results) No results for input(s): PROBNP in the last 8760 hours.  CBG: Recent Labs   Lab 08/14/20 1741 08/14/20 2048 08/15/20 0555 08/15/20 1139 08/15/20 1558  GLUCAP 109* 140* 134* 113* 101*       Signed:  Briant Cedar, MD [facility] 08/15/2020, 6:51 PM

## 2020-08-15 NOTE — Progress Notes (Signed)
Progress Note  Patient Name: Trevor Lewis Date of Encounter: 08/15/2020  [facility] Cardiologist: None new  Subjective   Feels well this am. Chest pain resolved.   Inpatient Medications    Scheduled Meds:  aspirin EC  81 mg Oral Daily   atorvastatin  40 mg Oral Daily   carvedilol  3.125 mg Oral BID WC   enoxaparin (LOVENOX) injection  40 mg Subcutaneous Q24H   insulin aspart  0-15 Units Subcutaneous TID WC   insulin aspart  0-5 Units Subcutaneous QHS   irbesartan  300 mg Oral Daily   metoprolol tartrate  100 mg Oral On Call   pantoprazole  40 mg Oral Daily   Continuous Infusions:  PRN Meds: acetaminophen, nitroGLYCERIN, ondansetron (ZOFRAN) IV   Vital Signs    Vitals:   08/14/20 2004 08/14/20 2346 08/15/20 0425 08/15/20 0428  BP: 140/85 (!) 142/82 (!) 133/105   Pulse: 60 61 61   Resp: 14 16 16    Temp: 98.5 F (36.9 C) 97.7 F (36.5 C) 97.8 F (36.6 C)   TempSrc: Oral Oral Oral   SpO2: 96% 100% 98%   Weight:    105.7 kg  Height:        Intake/Output Summary (Last 24 hours) at 08/15/2020 0736 Last data filed at 08/15/2020 0600 Gross per 24 hour  Intake 240 ml  Output 100 ml  Net 140 ml   Last 3 Weights 08/15/2020 08/14/2020 10/09/2019  Weight (lbs) 233 lb 1.6 oz 220 lb 236 lb  Weight (kg) 105.733 kg 99.791 kg 107.049 kg      Telemetry    NSR rate 64. - Personally Reviewed  ECG    None today - Personally Reviewed  Physical Exam    GEN: No acute distress.   Neck: No JVD Cardiac: RRR, no murmurs, rubs, or gallops.  Respiratory: Clear to auscultation bilaterally. GI: Soft, nontender, non-distended  MS: No edema; No deformity. Neuro:  Nonfocal  Psych: Normal affect   Labs    High Sensitivity Troponin:   Recent Labs  Lab 08/14/20 1042 08/14/20 1236  TROPONINIHS 5 4      Chemistry Recent Labs  Lab 08/14/20 1042 08/14/20 1816  NA 137  --   K 4.1  --   CL 98  --   CO2 29  --   GLUCOSE 132*  --   BUN 8  --   CREATININE 1.10  0.95  CALCIUM 9.1  --   GFRNONAA >60 >60  ANIONGAP 10  --      Hematology Recent Labs  Lab 08/14/20 1042 08/14/20 1816  WBC 5.6 5.1  RBC 5.50 5.54  HGB 16.8 17.1*  HCT 50.4 51.3  MCV 91.6 92.6  MCH 30.5 30.9  MCHC 33.3 33.3  RDW 11.9 11.9  PLT 277 286    BNPNo results for input(s): BNP, PROBNP in the last 168 hours.   DDimer No results for input(s): DDIMER in the last 168 hours.   Radiology    DG Shoulder Right  Result Date: 08/14/2020 CLINICAL DATA:  Right shoulder pain.  No known injury. EXAM: RIGHT SHOULDER - 2+ VIEW COMPARISON:  None. FINDINGS: There is no evidence of fracture or dislocation. Normal joint spaces and alignment. There is no evidence of arthropathy or other focal bone abnormality. Soft tissues are unremarkable. IMPRESSION: Negative radiographs of the right shoulder. Electronically Signed   By: 08/16/2020 M.D.   On: 08/14/2020 20:53   DG Chest [facility] 1 View  Result  Date: 08/14/2020 CLINICAL DATA:  Chest pain. EXAM: PORTABLE CHEST 1 VIEW COMPARISON:  01/10/2019. FINDINGS: Mediastinum hilar structures normal. Cardiomegaly. No pulmonary venous congestion. No focal infiltrate. No pleural effusion or pneumothorax. IMPRESSION: Cardiomegaly. No pulmonary venous congestion. No acute pulmonary disease. Electronically Signed   By: Maisie Fus  Register   On: 08/14/2020 11:15    Cardiac Studies   none  Patient Profile     40 y.o. male with remote history of pericarditis/effusion presents with chest pain. Risk factors of DM, HTN, HLD  Assessment & Plan    Chest pain. No evidence of MI. Patient does have risk factors for CAD including DM, HTN, HLD. I don't think there is any concern for pericarditis/effusion at this time based on exam, Ecg and CXR findings. I do think it is reasonable to pursue ischemic evaluation. Discussed options including stress testing, Coronary CTA or cardiac cath. I would recommend Coronary CTA since this will also allow evaluation of  pericardium and aorta. Will plan on doing this today post beta blocker load. If no obstructive CAD then can DC home with risk factor modification. If he does have significant obstruction then cardiac cath will be needed.  HTN poorly controlled. Would increase valsartan to 320 mg daily (or 300 mg Avapro). On low dose Coreg. Will monitor.  HLD. Mixed. LDL 125. With DM goal is < 70. Now on lipitor 40 mg daily.  DM  A1c 6.6%. Per primary team Remote pericarditis s/p pericardiocentesis in 2013. No recurrence.      For questions or updates, please contact [facility] Please consult [email or web address] for contact info under        Signed, Arrington Yohe [place], MD  08/15/2020, 7:36 AM

## 2020-08-15 NOTE — Progress Notes (Signed)
  Echocardiogram 2D Echocardiogram has been performed.  Nasira Janusz G Tayler Heiden 08/15/2020, 11:06 AM

## 2020-08-19 ENCOUNTER — Telehealth: Payer: Self-pay

## 2020-08-19 NOTE — Telephone Encounter (Signed)
Transition Care Management Follow-up Telephone Call Date of discharge and from where: 08/15/2020-[facility] How have you been since you were released from the hospital? Doing better Any questions or concerns? No  Items Reviewed: Did the pt receive and understand the discharge instructions provided? Yes  Medications obtained and verified? Yes  Other? Yes  Any new allergies since your discharge? No  Dietary orders reviewed? Yes Do you have support at home? Yes   Home Care and Equipment/Supplies: Were home health services ordered? no If so, what is the name of the agency? N/a  Has the agency set up a time to come to the patient's home? not applicable Were any new equipment or medical supplies ordered?  No What is the name of the medical supply agency? N/a Were you able to get the supplies/equipment? not applicable Do you have any questions related to the use of the equipment or supplies? N/a  Functional Questionnaire: (I = Independent and D = Dependent) ADLs: I  Bathing/Dressing- I  Meal Prep- I  Eating- I  Maintaining continence- I  Transferring/Ambulation- I  Managing Meds- I  Follow up appointments reviewed:  PCP Hospital f/u appt confirmed? Yes  Scheduled to see Dr. Carmelia Roller on 08/21/2020 @ 11:15. Specialist Hospital f/u appt confirmed?  N/a   Are transportation arrangements needed? No  If their condition worsens, is the pt aware to call PCP or go to the Emergency Dept.? Yes Was the patient provided with contact information for the PCP's office or ED? Yes Was to pt encouraged to call back with questions or concerns? Yes

## 2020-08-21 ENCOUNTER — Encounter: Payer: Self-pay | Admitting: Family Medicine

## 2020-08-21 ENCOUNTER — Other Ambulatory Visit: Payer: Self-pay

## 2020-08-21 ENCOUNTER — Ambulatory Visit (INDEPENDENT_AMBULATORY_CARE_PROVIDER_SITE_OTHER): Payer: 59 | Admitting: Family Medicine

## 2020-08-21 VITALS — BP 132/86 | HR 75 | Temp 98.2°F | Ht 71.0 in | Wt 234.2 lb

## 2020-08-21 DIAGNOSIS — I10 Essential (primary) hypertension: Secondary | ICD-10-CM

## 2020-08-21 DIAGNOSIS — E1165 Type 2 diabetes mellitus with hyperglycemia: Secondary | ICD-10-CM

## 2020-08-21 LAB — MICROALBUMIN / CREATININE URINE RATIO
Creatinine,U: 139.5 mg/dL
Microalb Creat Ratio: 0.6 mg/g (ref 0.0–30.0)
Microalb, Ur: 0.8 mg/dL (ref 0.0–1.9)

## 2020-08-21 MED ORDER — CARVEDILOL 12.5 MG PO TABS: 12.5000 mg | ORAL_TABLET | Freq: Two times a day (BID) | ORAL | 2 refills | Status: DC

## 2020-08-21 MED ORDER — PANTOPRAZOLE SODIUM 40 MG PO TBEC: 40.0000 mg | DELAYED_RELEASE_TABLET | Freq: Every day | ORAL | 2 refills | Status: DC

## 2020-08-21 NOTE — Patient Instructions (Addendum)
You labs looked good in the hospital.  Keep the diet clean and stay active.  Monitor your blood pressures at home.  We are increasing the dosage of your Coreg.  If you do not hear anything about your referral in the next 1-2 weeks, call our office and ask for an update.  Let us know if you need anything.

## 2020-08-21 NOTE — Progress Notes (Signed)
Chief Complaint  Patient presents with   Hospitalization Follow-up    Subjective: Patient is a 40 y.o. male here for hosp f/u.  Patient was admitted for chest pain rule out at [facility] on 7/20 and discharged on 7/21.  Troponins were negative as well as no EKG changes.  Coronary CT showed a coronary artery calcium score of 9.  He is currently taking Coreg 3.125 mg twice daily and Lipitor 40 mg daily.  He has not had any chest pain or shortness of breath since being hospitalized.  He is starting to walk again.  Diet is healthy overall.  Blood pressures have been ranging in the 120s-130s/70s-80s.  Past Medical History:  Diagnosis Date   Carpal tunnel syndrome 02/17/2016   GERD (gastroesophageal reflux disease) 02/17/2016   Hypertension    Pericardial effusion    TB lung, latent     Objective: BP 132/86   Pulse 75   Temp 98.2 F (36.8 C) (Oral)   Ht 5\' 11"  (1.803 m)   Wt 234 lb 4 oz (106.3 kg)   SpO2 97%   BMI 32.67 kg/m  General: Awake, appears stated age HEENT: MMM, EOMi Heart: RRR, no LE edema Lungs: CTAB, no rales, wheezes or rhonchi. No accessory muscle use Psych: Age appropriate judgment and insight, normal affect and mood  Assessment and Plan: Primary hypertension - Plan: carvedilol (COREG) 12.5 MG tablet  Type 2 diabetes mellitus with hyperglycemia, without long-term current use of insulin (HCC) - Plan: Microalbumin / creatinine urine ratio, Ambulatory referral to Ophthalmology  Chronic, uncontrolled.  Goal would be less than 130/80.  Cont valsartan 320 mg/d, increase Coreg from 3.125 mg bid to 12.5 mg bid to get him under 130/80. F/u in 6 weeks. Monitor BP at home. Ck urine, refer to ophtho.  The patient voiced understanding and agreement to the plan.  [place], DO 08/21/20  11:49 AM

## 2020-11-13 ENCOUNTER — Other Ambulatory Visit: Payer: Self-pay | Admitting: Family Medicine

## 2020-11-13 DIAGNOSIS — I10 Essential (primary) hypertension: Secondary | ICD-10-CM

## 2020-11-13 MED ORDER — VALSARTAN 160 MG PO TABS: 320.0000 mg | ORAL_TABLET | Freq: Every day | ORAL | 3 refills | Status: DC

## 2020-12-09 DIAGNOSIS — I10 Essential (primary) hypertension: Secondary | ICD-10-CM

## 2020-12-10 MED ORDER — VALSARTAN 160 MG PO TABS: 320.0000 mg | ORAL_TABLET | Freq: Every day | ORAL | 3 refills | Status: DC

## 2021-04-02 ENCOUNTER — Ambulatory Visit (INDEPENDENT_AMBULATORY_CARE_PROVIDER_SITE_OTHER): Payer: Managed Care, Other (non HMO) | Admitting: Family Medicine

## 2021-04-02 ENCOUNTER — Encounter: Payer: Self-pay | Admitting: Family Medicine

## 2021-04-02 VITALS — BP 152/102 | HR 69 | Temp 98.2°F | Ht 71.0 in | Wt 232.0 lb

## 2021-04-02 DIAGNOSIS — E1165 Type 2 diabetes mellitus with hyperglycemia: Secondary | ICD-10-CM | POA: Diagnosis not present

## 2021-04-02 DIAGNOSIS — Z1159 Encounter for screening for other viral diseases: Secondary | ICD-10-CM | POA: Diagnosis not present

## 2021-04-02 DIAGNOSIS — B353 Tinea pedis: Secondary | ICD-10-CM

## 2021-04-02 DIAGNOSIS — I10 Essential (primary) hypertension: Secondary | ICD-10-CM | POA: Diagnosis not present

## 2021-04-02 DIAGNOSIS — G5602 Carpal tunnel syndrome, left upper limb: Secondary | ICD-10-CM

## 2021-04-02 LAB — COMPREHENSIVE METABOLIC PANEL
ALT: 23 U/L (ref 0–53)
AST: 19 U/L (ref 0–37)
Albumin: 4.2 g/dL (ref 3.5–5.2)
Alkaline Phosphatase: 86 U/L (ref 39–117)
BUN: 11 mg/dL (ref 6–23)
CO2: 34 mEq/L — ABNORMAL HIGH (ref 19–32)
Calcium: 9.4 mg/dL (ref 8.4–10.5)
Chloride: 99 mEq/L (ref 96–112)
Creatinine, Ser: 1.03 mg/dL (ref 0.40–1.50)
GFR: 89.28 mL/min (ref >60.00)
Glucose, Bld: 122 mg/dL — ABNORMAL HIGH (ref 70–99)
Potassium: 4.4 mEq/L (ref 3.5–5.1)
Sodium: 138 mEq/L (ref 135–145)
Total Bilirubin: 0.8 mg/dL (ref 0.2–1.2)
Total Protein: 7.2 g/dL (ref 6.0–8.3)

## 2021-04-02 LAB — LIPID PANEL
Cholesterol: 207 mg/dL — ABNORMAL HIGH (ref 0–200)
HDL: 39.6 mg/dL (ref >39.00)
LDL Cholesterol: 130 mg/dL — ABNORMAL HIGH (ref 0–99)
NonHDL: 167.26
Total CHOL/HDL Ratio: 5
Triglycerides: 184 mg/dL — ABNORMAL HIGH (ref 0.0–149.0)
VLDL: 36.8 mg/dL (ref 0.0–40.0)

## 2021-04-02 LAB — HEMOGLOBIN A1C: Hgb A1c MFr Bld: 6.7 % — ABNORMAL HIGH (ref 4.6–6.5)

## 2021-04-02 LAB — MICROALBUMIN / CREATININE URINE RATIO
Creatinine,U: 146.5 mg/dL
Microalb Creat Ratio: 0.9 mg/g (ref 0.0–30.0)
Microalb, Ur: 1.4 mg/dL (ref 0.0–1.9)

## 2021-04-02 MED ORDER — VALSARTAN 320 MG PO TABS: 320.0000 mg | ORAL_TABLET | Freq: Every day | ORAL | 3 refills | Status: DC

## 2021-04-02 MED ORDER — KETOCONAZOLE 2 % EX CREA: 1.0000 "application " | TOPICAL_CREAM | Freq: Every day | CUTANEOUS | 0 refills | Status: AC

## 2021-04-02 NOTE — Patient Instructions (Addendum)
Keep the diet clean and stay active. ? ?Give [place] 2-3 business days to get the results of your labs back.  ? ?Check your blood pressures 2-3 times per week, alternating the time of day you check it. If it is high, considering waiting 1-2 minutes and rechecking. If it gets higher, your anxiety is likely creeping up and we should avoid rechecking.  ? ?Wear the brace at night and during aggravating activities.  ? ?If you do not hear anything about your referral in the next 1-2 weeks, call our office and ask for an update. ? ?Let us know if you need anything. ?

## 2021-04-02 NOTE — Progress Notes (Signed)
Subjective:  ? ?Chief Complaint  ?Patient presents with  ? Hypertension  ?  Yesterday 160/102 ?Out of his BP medication ?  ? ? ?Trevor Lewis is a 41 y.o. male here for follow-up of diabetes.   ?Mia's self monitored glucose range is low 100's.  ?Patient denies hypoglycemic reactions. ?He checks his glucose levels every other day.  ?Patient does not require insulin.   ?Medications include: Metformin 500 mg bid ?Diet is improving.  ?Exercise: walking ? ?Hypertension ?He does monitor home blood pressures. ?Blood pressures ranging from 150-160's/100's on average. ?He is noncompliant with medications as he ran out 1 mo ago. ?Patient has these side effects of medication: none ?No Cp or SOB.  ? ?Past Medical History:  ?Diagnosis Date  ? Carpal tunnel syndrome 02/17/2016  ? GERD (gastroesophageal reflux disease) 02/17/2016  ? Hypertension   ? Pericardial effusion   ? TB lung, latent   ?  ? ?Related testing: ?Retinal exam: Due ?Pneumovax: done ? ?Objective:  ?BP (!) 152/102   Pulse 69   Temp 98.2 ?F (36.8 ?C) (Oral)   Ht 5\' 11"  (1.803 m)   Wt 232 lb (105.2 kg)   SpO2 96%   BMI 32.36 kg/m?  ?General:  Well developed, well nourished, in no apparent distress ?Skin: macerated tissue between 2/3, 3/4, 4/5 toes b/l feet; otherwise Warm, no pallor or diaphoresis ?Head:  Normocephalic, atraumatic ?Eyes:  Pupils equal and round, sclera anicteric without injection  ?Lungs:  CTAB, no access msc use ?Cardio:  RRR, no bruits, no LE edema ?Musculoskeletal:  Symmetrical muscle groups noted without atrophy or deformity ?Neuro:  Sensation intact to pinprick on feet b/l; +Phalens on L, neg Tinel's ?Psych: Age appropriate judgment and insight ? ?Assessment:  ? ?Type 2 diabetes mellitus with hyperglycemia, without long-term current use of insulin (HCC), Chronic - Plan: Ambulatory referral to Ophthalmology, Hemoglobin A1c, Lipid panel, Microalbumin / creatinine urine ratio, Comprehensive metabolic panel ? ?Essential hypertension - Plan:  valsartan (DIOVAN) 320 MG tablet ? ?Encounter for hepatitis C screening test for low risk patient - Plan: Hepatitis C antibody ? ?Tinea pedis of both feet - Plan: ketoconazole (NIZORAL) 2 % cream ? ?Carpal tunnel syndrome of left wrist  ? ?Plan:  ? ?Chronic, stable. Cont metformin 500 mg bid. Counseled on diet and exercise. Refer ophtho again. He will reach out in 1-2 weeks if he does not hear anything. ?Chronic, unstable. Restart valsartan 320 mg/d. Monitor BP at home. F/u in 1 mo. ?Ck Hep C ab. ?Cream qd for 6 weeks. ?Wrist brace given today. Wear at night and during aggravating activities. If no improvement, will consider injection vs referral to hand.  ?The patient voiced understanding and agreement to the plan. ? ? , DO ?04/02/21 ?10:25 AM ? ?

## 2021-04-03 LAB — HEPATITIS C ANTIBODY
Hepatitis C Ab: NONREACTIVE
SIGNAL TO CUT-OFF: <0.02 (ref <1.00)

## 2021-05-08 LAB — HM DIABETES EYE EXAM

## 2021-05-09 ENCOUNTER — Encounter: Payer: Self-pay | Admitting: Family Medicine

## 2021-06-20 ENCOUNTER — Ambulatory Visit (INDEPENDENT_AMBULATORY_CARE_PROVIDER_SITE_OTHER): Payer: Commercial Managed Care - HMO | Admitting: Family Medicine

## 2021-06-20 ENCOUNTER — Encounter: Payer: Self-pay | Admitting: Family Medicine

## 2021-06-20 VITALS — BP 136/86 | HR 74 | Temp 98.7°F | Ht 71.0 in | Wt 235.2 lb

## 2021-06-20 DIAGNOSIS — R079 Chest pain, unspecified: Secondary | ICD-10-CM | POA: Diagnosis not present

## 2021-06-20 DIAGNOSIS — I10 Essential (primary) hypertension: Secondary | ICD-10-CM

## 2021-06-20 MED ORDER — HYDROCHLOROTHIAZIDE 25 MG PO TABS: 25.0000 mg | ORAL_TABLET | Freq: Every day | ORAL | 3 refills | Status: DC

## 2021-06-20 MED ORDER — VALSARTAN 320 MG PO TABS: 320.0000 mg | ORAL_TABLET | Freq: Every day | ORAL | 3 refills | Status: DC

## 2021-06-20 MED ORDER — PANTOPRAZOLE SODIUM 40 MG PO TBEC: 40.0000 mg | DELAYED_RELEASE_TABLET | Freq: Every day | ORAL | 2 refills | Status: DC

## 2021-06-20 NOTE — Progress Notes (Signed)
Chief Complaint  Patient presents with   Chest Pain    When working or exercising.  Hard to breathe Tingling of left and right shoulder Both legs swelling     Trevor Lewis is a 41 y.o. male here for evaluation of central chest pain.  Duration of issue: 2 days Quality: burning, achy Palliation: Rest Provocation: exercise Severity: 10/10 when it happened yesterday Radiation: L upper back Duration of chest pain: 2 minutes Associated symptoms: SOB Cardiac history: HTN, DM Family heart history: No Smoker? No CACS in 7/22 was 9. He takes Lipitor daily.   Hypertension Patient presents for hypertension follow up. He does monitor home blood pressures. Blood pressures ranging on average from 130's/70-80's. He is compliant with medications-carvedilol 12.5 mg twice daily, valsartan 320 mg daily. Patient has these side effects of medication: none He is adhering to a healthy diet overall. Exercise: Walking, lifting weights   Past Medical History:  Diagnosis Date   Carpal tunnel syndrome 02/17/2016   GERD (gastroesophageal reflux disease) 02/17/2016   Hypertension    Pericardial effusion    TB lung, latent    Family History  Problem Relation Age of Onset   Colon cancer Neg Hx    Esophageal cancer Neg Hx     BP 136/86 (BP Location: Left Arm, Cuff Size: Large)   Pulse 74   Temp 98.7 F (37.1 C) (Oral)   Ht 5\' 11"  (1.803 m)   Wt 235 lb 4 oz (106.7 kg)   SpO2 99%   BMI 32.81 kg/m  Gen: awake, alert, appears stated age HEENT: PERRLA, MMM Neck: No masses or asymmetry Heart: RRR, no bruits, 1+ pitting bilateral LE edema tapering at the proximal third of the tibia Lungs: CTAB, no accessory muscle use Abd: Soft, NT, ND, no masses or organomegaly MSK: chest pain is not reproducible to palptation Psych: Age appropriate judgment and insight, nml mood and affect  Exertional chest pain - Plan: EKG 12-Lead, Ambulatory referral to Cardiology  Essential hypertension - Plan:  hydrochlorothiazide (HYDRODIURIL) 25 MG tablet, valsartan (DIOVAN) 320 MG tablet  New problem, uncertain prog. EKG shows NSR, normal axis, no interval abnormalities, no ST segment or T wave changes, good R wave progression. Refer cards. CACS 9 in 07/2020. Stay on statin.  Chronic, not quite at goal. Cont valsartan 320 mg/d and Coreg 12.5 mg bid. Start HCTZ 25 mg/d. F/u in 2 weeks to reck blood pressure and blood work.  The patient voiced understanding and agreement to the plan.  08/2020 [place], DO 06/20/21 1:57 PM

## 2021-06-20 NOTE — Patient Instructions (Addendum)
If you do not hear anything about your referral in the next 1-2 weeks, call our office and ask for an update.  Check your blood pressures 2-3 times per week, alternating the time of day you check it. If it is high, considering waiting 1-2 minutes and rechecking. If it gets higher, your anxiety is likely creeping up and we should avoid rechecking.   Keep making healthy eating choices.  Take it easy physically for now and listen to your body.   Let us know if you need anything.

## 2021-07-04 ENCOUNTER — Ambulatory Visit (INDEPENDENT_AMBULATORY_CARE_PROVIDER_SITE_OTHER): Payer: Commercial Managed Care - HMO | Admitting: Family Medicine

## 2021-07-04 ENCOUNTER — Encounter: Payer: Self-pay | Admitting: Family Medicine

## 2021-07-04 VITALS — BP 140/86 | HR 67 | Temp 98.2°F | Ht 71.0 in | Wt 237.1 lb

## 2021-07-04 DIAGNOSIS — E1165 Type 2 diabetes mellitus with hyperglycemia: Secondary | ICD-10-CM | POA: Diagnosis not present

## 2021-07-04 DIAGNOSIS — I10 Essential (primary) hypertension: Secondary | ICD-10-CM | POA: Diagnosis not present

## 2021-07-04 DIAGNOSIS — R079 Chest pain, unspecified: Secondary | ICD-10-CM | POA: Diagnosis not present

## 2021-07-04 LAB — BASIC METABOLIC PANEL
BUN: 12 mg/dL (ref 6–23)
CO2: 31 mEq/L (ref 19–32)
Calcium: 9.7 mg/dL (ref 8.4–10.5)
Chloride: 98 mEq/L (ref 96–112)
Creatinine, Ser: 1.1 mg/dL (ref 0.40–1.50)
GFR: 82.36 mL/min (ref >60.00)
Glucose, Bld: 106 mg/dL — ABNORMAL HIGH (ref 70–99)
Potassium: 4.1 mEq/L (ref 3.5–5.1)
Sodium: 137 mEq/L (ref 135–145)

## 2021-07-04 MED ORDER — VALSARTAN 320 MG PO TABS: 320.0000 mg | ORAL_TABLET | Freq: Every day | ORAL | 3 refills | Status: DC

## 2021-07-04 MED ORDER — CARVEDILOL 25 MG PO TABS: 25.0000 mg | ORAL_TABLET | Freq: Two times a day (BID) | ORAL | 2 refills | Status: AC

## 2021-07-04 MED ORDER — HYDROCHLOROTHIAZIDE 25 MG PO TABS: 25.0000 mg | ORAL_TABLET | Freq: Every day | ORAL | 2 refills | Status: DC

## 2021-07-04 MED ORDER — ATORVASTATIN CALCIUM 40 MG PO TABS: 40.0000 mg | ORAL_TABLET | Freq: Every day | ORAL | 2 refills | Status: AC

## 2021-07-04 NOTE — Patient Instructions (Addendum)
Keep the diet clean and stay active.  Give [place] 2-3 business days to get the results of your labs back.   Continue to monitor your blood pressure at home intermittently. If it does not start settling in the 120-130 range on top consistently, please let me know.   If you do not hear anything about your referral in the next 1-2 weeks, call our office and ask for an update.  Let us know if you need anything.

## 2021-07-04 NOTE — Progress Notes (Signed)
Chief Complaint  Patient presents with   Follow-up    Subjective Trevor Lewis is a 41 y.o. male who presents for hypertension follow up. He does monitor home blood pressures. Blood pressures ranging from 120-140's/70's on average. He is compliant with medications- valsartan 320 mg/d, Coreg 12.5 mg bid, HCTZ 25 mg/d was recently added 2 weeks ago. Patient has these side effects of medication: none He is sometimes adhering to a healthy diet overall. Current exercise: walking, running No CP or SOB.    Past Medical History:  Diagnosis Date   Carpal tunnel syndrome 02/17/2016   GERD (gastroesophageal reflux disease) 02/17/2016   Hypertension    Pericardial effusion    TB lung, latent     Exam BP 140/86   Pulse 67   Temp 98.2 F (36.8 C) (Oral)   Ht 5\' 11"  (1.803 m)   Wt 237 lb 2 oz (107.6 kg)   SpO2 99%   BMI 33.07 kg/m  General:  well developed, well nourished, in no apparent distress Heart: RRR, no bruits, no LE edema Lungs: clear to auscultation, no accessory muscle use Psych: well oriented with normal range of affect and appropriate judgment/insight  Essential hypertension - Plan: Basic metabolic panel  Chronic, uncontrolled. Cont HCTZ 25 mg/d, valsartan 320 mg/d. We will increase Coreg from 12.5 mg bid to 25 mg bid. Monitor BP at home. If >140/90 consistently, will have him RTC. Counseled on diet and exercise. Will see if we can get him in w cards in another group sooner than the end of July at his request.  F/u in 6 mo for CPE or prn otherwise. The patient voiced understanding and agreement to the plan.  [place], DO 07/04/21  10:30 AM

## 2021-08-18 NOTE — Progress Notes (Unsigned)
Cardiology Office Note:    Date:  08/19/2021   ID:  Trevor Lewis, DOB Apr 05, 1980, MRN [id number]  PCP:  Sharlene Dory, DO  Cardiologist:  Norman Herrlich, MD   Referring MD: Sharlene Dory*  ASSESSMENT:    1. Chest pain, precordial   2. Essential hypertension   3. Mixed hyperlipidemia    PLAN:    In order of problems listed above:  His chest pain pattern is most consistent with costochondral pain syndrome however the differential diagnosis includes recurrent or chronic pericarditis.  He has had no fever chills no rub no EKG changes and he had no pericardial thickening or effusion a year ago we will check inflammatory markers C-reactive protein sedimentation rate and repeat echocardiogram especially with his EKG abnormalities.  I will give him a 2-week course of Celebrex which can often improve this chronic recurrent costochondral pain syndrome.  I do not think he requires a repeat ischemia evaluation. Continue current treatment I am not can alter his medications on a single isolated reading in the office Continue his statin  Next appointment as needed, the echocardiogram shows no pericardial thickening or effusion pattern of infarction and if his inflammatory markers are normal I will see him back in the office as needed.   Medication Adjustments/Labs and Tests Ordered: Current medicines are reviewed at length with the patient today.  Concerns regarding medicines are outlined above.  No orders of the defined types were placed in this encounter.  No orders of the defined types were placed in this encounter.    Chief Complaint  Patient presents with   Chest Pain    History of Present Illness:    Trevor Lewis is a 41 y.o. male with type 2 diabetes hypertension and hyperlipidemia who is being seen today for the evaluation of chest pain at the request of Sharlene Dory*.  He was previously seen by Dr. Peter [place] during hospitalization 08/14/2020.   Also has a history of pericarditis and pericardiocentesis in 2013  Had a cardiac CTA reported as 08/15/2020 with a calcium score relatively low at 9/87th percentile and have mild coronary atherosclerosis less than 25% in the right coronary artery.  An echocardiogram performed 08/15/2020 showed moderate concentric LVH.  Grade 1 diastolic dysfunction normal systolic function ejection fraction 58% right ventricle normal in size and no valvular abnormality.  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  He has a pattern of chest pain that is more positional and lifting than it is anginal in nature brief and last about a minute localized predominantly to the left sternal border and radiates towards the left shoulder.  No associated shortness of breath not pleuritic in nature no fevers or chills no nausea vomiting or diaphoresis he has an episode perhaps once every month.  He has no rheumatologic disease. 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 Past Medical History:  Diagnosis Date   Carpal tunnel syndrome 02/17/2016   GERD (gastroesophageal reflux disease) 02/17/2016   Hypertension    Pericardial effusion    TB  lung, latent     Past Surgical History:  Procedure Laterality Date   COLONOSCOPY  2013?   PERICARDIOCENTESIS     UPPER GASTROINTESTINAL ENDOSCOPY  2013   hiatal hernia    Current Medications: Current Meds  Medication Sig   acetaminophen (TYLENOL) 500 MG tablet Take 1,000 mg by mouth every 6 (six) hours as needed for mild pain.   albuterol (VENTOLIN HFA) 108 (90 Base) MCG/ACT  inhaler Inhale 2 puffs into the lungs every 6 (six) hours as needed for wheezing or shortness of breath.   atorvastatin (LIPITOR) 40 MG tablet Take 1 tablet (40 mg total) by mouth daily.   calcium carbonate (TUMS EX) 750 MG chewable tablet Chew 1 tablet by mouth daily as needed for heartburn.   carvedilol (COREG) 25 MG tablet Take 1 tablet (25 mg total) by mouth 2 (two) times daily with a meal.   hydrochlorothiazide (HYDRODIURIL) 25 MG tablet Take 1 tablet (25 mg total) by mouth daily.   metFORMIN (GLUCOPHAGE) 500 MG tablet TAKE 1 TABLET (500 MG TOTAL) BY MOUTH 2 (TWO) TIMES DAILY WITH A MEAL.   pantoprazole (PROTONIX) 40 MG tablet Take 1 tablet (40 mg total) by mouth daily before breakfast.   valsartan (DIOVAN) 320 MG tablet Take 1 tablet (320 mg total) by mouth daily.     Allergies:   Patient has no known allergies.   Social History   Socioeconomic History   Marital status: Married    Spouse name: Not on file   Number of children: Not on file   Years of education: Not on file   Highest education level: Not on file  Occupational History   Not on file  Tobacco Use   Smoking status: Never   Smokeless tobacco: Never  Substance and Sexual Activity   Alcohol use: No   Drug use: No   Sexual activity: Not on file  Other Topics Concern   Not on file  Social History Narrative   Married, originally from [place]. In the [place] since 2006 or 2007. One son to daughters. No caffeine no alcohol no tobacco. No drug use.   03/04/2016   Social Determinants of Health   Financial Resource Strain: Not on file  Food Insecurity: Not on file  Transportation Needs: Not on file  Physical Activity: Not on file  Stress: Not on file  Social Connections: Not on file     Family History: The patient's family history is negative for Colon cancer and Esophageal cancer.  ROS:   ROS Please see the history of present illness.     All other systems reviewed and are negative.  EKGs/Labs/Other Studies Reviewed:     The following studies were reviewed today:   EKG:  EKG is  ordered today.  The ekg ordered today is personally reviewed and demonstrates sinus rhythm similar pattern of left atrial enlargement left axis deviation and late transition in the precordial leads.  He also has T wave abnormality 1 aVL nonspecific  Recent Labs: 04/02/2021: ALT 23 07/04/2021: BUN 12; Creatinine, Ser 1.10; Potassium 4.1; Sodium 137  Recent Lipid Panel    Component Value Date/Time   CHOL 207 (H) 04/02/2021 1026   TRIG 184.0 (H) 04/02/2021 1026   HDL 39.60 04/02/2021 1026   CHOLHDL 5 04/02/2021 1026   VLDL 36.8 04/02/2021 1026   LDLCALC 130 (H) 04/02/2021 1026   LDLDIRECT 94.0 12/27/2018 1607    Physical Exam:    VS:  BP (!) 152/108 (BP Location: Left Arm, Patient  Position: Sitting, Cuff Size: Normal)   Pulse 80   Ht 5\' 11"  (1.803 m)   Wt 231 lb (104.8 kg)   SpO2 96%   BMI 32.22 kg/m     Wt Readings from Last 3 Encounters:  08/19/21 231 lb (104.8 kg)  07/04/21 237 lb 2 oz (107.6 kg)  06/20/21 235 lb 4 oz (106.7 kg)     GEN: Appears healthy looks his age well nourished, well developed in no acute distress HEENT: Normal NECK: No JVD; No carotid bruits LYMPHATICS: No lymphadenopathy CARDIAC: RRR, no murmurs, rubs, gallops RESPIRATORY:  Clear to auscultation without rales, wheezing or rhonchi  ABDOMEN: Soft, non-tender, non-distended MUSCULOSKELETAL:  No edema; No deformity  SKIN: Warm and dry NEUROLOGIC:  Alert and oriented x 3 PSYCHIATRIC:  Normal affect     Signed, 06/22/21, MD  08/19/2021 10:04 AM    [facility]

## 2021-08-19 ENCOUNTER — Ambulatory Visit (INDEPENDENT_AMBULATORY_CARE_PROVIDER_SITE_OTHER): Payer: Commercial Managed Care - HMO | Admitting: Cardiology

## 2021-08-19 ENCOUNTER — Encounter: Payer: Self-pay | Admitting: Cardiology

## 2021-08-19 VITALS — BP 152/108 | HR 80 | Ht 71.0 in | Wt 231.0 lb

## 2021-08-19 DIAGNOSIS — E782 Mixed hyperlipidemia: Secondary | ICD-10-CM | POA: Diagnosis not present

## 2021-08-19 DIAGNOSIS — I10 Essential (primary) hypertension: Secondary | ICD-10-CM | POA: Diagnosis not present

## 2021-08-19 DIAGNOSIS — R072 Precordial pain: Secondary | ICD-10-CM

## 2021-08-19 MED ORDER — CELECOXIB 100 MG PO CAPS: 100.0000 mg | ORAL_CAPSULE | Freq: Two times a day (BID) | ORAL | 0 refills | Status: DC

## 2021-08-19 NOTE — Patient Instructions (Signed)
Medication Instructions:  Your physician has recommended you make the following change in your medication:   START: Celebrex 100 mg twice daily for two weeks.  *If you need a refill on your cardiac medications before your next appointment, please call your pharmacy*   Lab Work: Your physician recommends that you return for lab work in:   Labs today: Sed Rate, CRP  If you have labs (blood work) drawn today and your tests are completely normal, you will receive your results only by: MyChart Message (if you have MyChart) OR A paper copy in the mail If you have any lab test that is abnormal or we need to change your treatment, we will call you to review the results.   Testing/Procedures: Your physician has requested that you have an echocardiogram. Echocardiography is a painless test that uses sound waves to create images of your heart. It provides your doctor with information about the size and shape of your heart and how well your heart's chambers and valves are working. This procedure takes approximately one hour. There are no restrictions for this procedure.    Follow-Up: At [facility], you and your health needs are our priority.  As part of our continuing mission to provide you with exceptional heart care, we have created designated Provider Care Teams.  These Care Teams include your primary Cardiologist (physician) and Advanced Practice Providers (APPs -  Physician Assistants and Nurse Practitioners) who all work together to provide you with the care you need, when you need it.  We recommend signing up for the patient portal called "MyChart".  Sign up information is provided on this After Visit Summary.  MyChart is used to connect with patients for Virtual Visits (Telemedicine).  Patients are able to view lab/test results, encounter notes, upcoming appointments, etc.  Non-urgent messages can be sent to your provider as well.   To learn more about what you can do with MyChart, go to  [email or web address].    Your next appointment:   Follow up as needed  The format for your next appointment:   In Person  Provider:   Norman Herrlich, MD    Other Instructions None  Important Information About Sugar

## 2021-08-19 NOTE — Addendum Note (Signed)
Addended by: Roosvelt Harps R on: 08/19/2021 11:38 AM   Modules accepted: Orders

## 2021-08-19 NOTE — Addendum Note (Signed)
Addended by: Roosvelt Harps R on: 08/19/2021 01:04 PM   Modules accepted: Orders

## 2021-09-09 ENCOUNTER — Ambulatory Visit (HOSPITAL_BASED_OUTPATIENT_CLINIC_OR_DEPARTMENT_OTHER)
Admission: RE | Admit: 2021-09-09 | Discharge: 2021-09-09 | Disposition: A | Discharge status: Home / Self Care | Payer: Commercial Managed Care - HMO | Source: Ambulatory Visit | Attending: Cardiology | Admitting: Cardiology

## 2021-09-09 DIAGNOSIS — E782 Mixed hyperlipidemia: Secondary | ICD-10-CM

## 2021-09-09 DIAGNOSIS — I10 Essential (primary) hypertension: Secondary | ICD-10-CM

## 2021-09-09 DIAGNOSIS — R072 Precordial pain: Secondary | ICD-10-CM | POA: Diagnosis present

## 2021-09-09 LAB — ECHOCARDIOGRAM COMPLETE
Area-P 1/2: 3.72 cm2
S' Lateral: 2.5 cm

## 2021-09-09 NOTE — Progress Notes (Signed)
  Echocardiogram 2D Echocardiogram has been performed.  Trevor Lewis F 09/09/2021, 10:29 AM

## 2021-09-17 ENCOUNTER — Telehealth: Payer: Self-pay

## 2021-09-17 NOTE — Telephone Encounter (Signed)
Left VM and My Chart message regarding normal results per Dr. Hulen Shouts note. Routed to PCP.

## 2021-11-12 ENCOUNTER — Telehealth: Payer: Self-pay | Admitting: Family Medicine

## 2021-11-12 NOTE — Telephone Encounter (Signed)
Dr. Felix Ahmadi with [facility] called to advise that she saw patient and his Blood pressure was 190/127. She said that patient has been non compliant with Blood Pressure and cholesterol meds and patient said he would start retaking his cholesterol meds but is out.She is not sure if he may need more refills but she talked to him extensively about the risks. She would like to see if Dr. Irene Limbo nurse can follow up with patient regarding this.She will reach out to Dr. Pernell Dupre office for cardiology office visit notes.

## 2021-11-12 NOTE — Telephone Encounter (Signed)
Called and scheduled with PCP for Friday 11/14/21 at 2:15.

## 2021-11-12 NOTE — Telephone Encounter (Signed)
Pt was seen at [facility] today and they are requesting last ov notes from our office faxed to [phone].

## 2021-11-12 NOTE — Telephone Encounter (Signed)
Notes faxed to [facility]

## 2021-11-14 ENCOUNTER — Ambulatory Visit (INDEPENDENT_AMBULATORY_CARE_PROVIDER_SITE_OTHER): Payer: Commercial Managed Care - HMO | Admitting: Family Medicine

## 2021-11-14 ENCOUNTER — Encounter: Payer: Self-pay | Admitting: Family Medicine

## 2021-11-14 VITALS — BP 154/102 | HR 68 | Temp 97.7°F | Ht 71.0 in | Wt 238.0 lb

## 2021-11-14 DIAGNOSIS — G4733 Obstructive sleep apnea (adult) (pediatric): Secondary | ICD-10-CM

## 2021-11-14 DIAGNOSIS — I10 Essential (primary) hypertension: Secondary | ICD-10-CM

## 2021-11-14 DIAGNOSIS — E1165 Type 2 diabetes mellitus with hyperglycemia: Secondary | ICD-10-CM

## 2021-11-14 MED ORDER — AMLODIPINE-VALSARTAN-HCTZ 10-320-25 MG PO TABS: 1.0000 | ORAL_TABLET | Freq: Every day | ORAL | 1 refills | Status: DC

## 2021-11-14 NOTE — Patient Instructions (Addendum)
Keep the diet clean and stay active.  If you do not hear anything about your referral in the next 1-2 weeks, call our office and ask for an update.  Aim to do some physical exertion for 150 minutes per week. This is typically divided into 5 days per week, 30 minutes per day. The activity should be enough to get your heart rate up. Anything is better than nothing if you have time constraints.  Check your blood pressures 2-3 times per week, alternating the time of day you check it. If it is high, considering waiting 1-2 minutes and rechecking. If it gets higher, your anxiety is likely creeping up and we should avoid rechecking.   Please bring your blood pressure monitor to your next appt.   Let us know if you need anything.

## 2021-11-14 NOTE — Progress Notes (Signed)
Chief Complaint  Patient presents with   Hypertension    Here for BP   Insomnia    Subjective Trevor Lewis is a 41 y.o. male who presents for hypertension follow up. He does monitor home blood pressures. Blood pressures ranging from 120's/70-80's on average. Was high at his eye doc's office.  He gets very nervous when he comes to doctor's office.  He is compliant with medications-Coreg 25 mg twice daily, hydrochlorothiazide 25 mg daily, valsartan 320 mg daily. Patient has these side effects of medication: none He is adhering to a healthy diet overall. Current exercise: walking, a little running, weight lifting No CP or SOB.  He has a history of sleep apnea.  He reports had a sleep study done and was told they would order his CPAP but he never heard anything.  This was around 2 years ago.  He continues to snore and his wife notes that he will sometimes stop breathing.   Past Medical History:  Diagnosis Date   Carpal tunnel syndrome 02/17/2016   GERD (gastroesophageal reflux disease) 02/17/2016   Hypertension    Pericardial effusion    TB lung, latent     Exam BP (!) 154/102 (BP Location: Left Arm, Cuff Size: Normal)   Pulse 68   Temp 97.7 F (36.5 C)   Ht 5\' 11"  (1.803 m)   Wt 238 lb (108 kg)   SpO2 98%   BMI 33.19 kg/m  General:  well developed, well nourished, in no apparent distress Heart: RRR, no bruits, no LE edema Lungs: clear to auscultation, no accessory muscle use Psych: well oriented with normal range of affect and appropriate judgment/insight  Essential hypertension - Plan: amLODIPine-Valsartan-HCTZ 10-320-25 MG TABS  OSA (obstructive sleep apnea) - Plan: Ambulatory referral to Pulmonology  Type 2 diabetes mellitus with hyperglycemia, without long-term current use of insulin (HCC) - Plan: Hemoglobin A1c  Chronic, unstable.  Stop valsartan 320 mg/d, hctz 25 mg/d, continue Coreg 25 mg bid.  We will send in Exforge HCT 10-320-25 mg daily.  Monitor blood  pressure at home.  Counseled on diet and exercise.  We will refer him back to pulmonary to discuss sleep apnea treatment as this could be contributing to his relatively refractory hypertension. Check A1c today. He politely declined a tetanus shot and the flu shot. F/u in 1 mo. The patient voiced understanding and agreement to the plan.  [place], DO 11/14/21  2:33 PM

## 2021-11-15 LAB — HEMOGLOBIN A1C
Hgb A1c MFr Bld: 8 % of total Hgb — ABNORMAL HIGH (ref <5.7)
Mean Plasma Glucose: 183 mg/dL
eAG (mmol/L): 10.1 mmol/L

## 2021-12-11 ENCOUNTER — Other Ambulatory Visit: Payer: Self-pay | Admitting: Family Medicine

## 2021-12-11 DIAGNOSIS — I10 Essential (primary) hypertension: Secondary | ICD-10-CM

## 2022-01-02 ENCOUNTER — Encounter: Payer: Self-pay | Admitting: Family Medicine

## 2022-01-02 ENCOUNTER — Ambulatory Visit (INDEPENDENT_AMBULATORY_CARE_PROVIDER_SITE_OTHER): Payer: Commercial Managed Care - HMO | Admitting: Family Medicine

## 2022-01-02 VITALS — BP 124/88 | HR 64 | Temp 97.9°F | Ht 71.0 in | Wt 235.0 lb

## 2022-01-02 DIAGNOSIS — M79675 Pain in left toe(s): Secondary | ICD-10-CM

## 2022-01-02 DIAGNOSIS — Z0001 Encounter for general adult medical examination with abnormal findings: Secondary | ICD-10-CM | POA: Diagnosis not present

## 2022-01-02 DIAGNOSIS — E1165 Type 2 diabetes mellitus with hyperglycemia: Secondary | ICD-10-CM | POA: Diagnosis not present

## 2022-01-02 DIAGNOSIS — Z0000 Encounter for general adult medical examination without abnormal findings: Secondary | ICD-10-CM

## 2022-01-02 LAB — LIPID PANEL
Cholesterol: 182 mg/dL (ref 0–200)
HDL: 34.1 mg/dL — ABNORMAL LOW (ref >39.00)
NonHDL: 147.46
Total CHOL/HDL Ratio: 5
Triglycerides: 237 mg/dL — ABNORMAL HIGH (ref 0.0–149.0)
VLDL: 47.4 mg/dL — ABNORMAL HIGH (ref 0.0–40.0)

## 2022-01-02 LAB — CBC
HCT: 44.2 % (ref 39.0–52.0)
Hemoglobin: 14.6 g/dL (ref 13.0–17.0)
MCHC: 33.1 g/dL (ref 30.0–36.0)
MCV: 91.7 fl (ref 78.0–100.0)
Platelets: 316 10*3/uL (ref 150.0–400.0)
RBC: 4.82 Mil/uL (ref 4.22–5.81)
RDW: 13.1 % (ref 11.5–15.5)
WBC: 4.8 10*3/uL (ref 4.0–10.5)

## 2022-01-02 LAB — URIC ACID: Uric Acid, Serum: 6.1 mg/dL (ref 4.0–7.8)

## 2022-01-02 LAB — COMPREHENSIVE METABOLIC PANEL
ALT: 18 U/L (ref 0–53)
AST: 15 U/L (ref 0–37)
Albumin: 4.1 g/dL (ref 3.5–5.2)
Alkaline Phosphatase: 81 U/L (ref 39–117)
BUN: 16 mg/dL (ref 6–23)
CO2: 30 mEq/L (ref 19–32)
Calcium: 9.6 mg/dL (ref 8.4–10.5)
Chloride: 100 mEq/L (ref 96–112)
Creatinine, Ser: 1.07 mg/dL (ref 0.40–1.50)
GFR: 84.84 mL/min (ref >60.00)
Glucose, Bld: 203 mg/dL — ABNORMAL HIGH (ref 70–99)
Potassium: 3.9 mEq/L (ref 3.5–5.1)
Sodium: 137 mEq/L (ref 135–145)
Total Bilirubin: 0.4 mg/dL (ref 0.2–1.2)
Total Protein: 7 g/dL (ref 6.0–8.3)

## 2022-01-02 LAB — HEMOGLOBIN A1C: Hgb A1c MFr Bld: 7.6 % — ABNORMAL HIGH (ref 4.6–6.5)

## 2022-01-02 LAB — LDL CHOLESTEROL, DIRECT: Direct LDL: 114 mg/dL

## 2022-01-02 MED ORDER — MELOXICAM 15 MG PO TABS: 15.0000 mg | ORAL_TABLET | Freq: Every day | ORAL | 0 refills | Status: DC

## 2022-01-02 MED ORDER — METFORMIN HCL 500 MG PO TABS: 500.0000 mg | ORAL_TABLET | Freq: Two times a day (BID) | ORAL | 2 refills | Status: DC

## 2022-01-02 NOTE — Patient Instructions (Addendum)
Give [place] 2-3 business days to get the results of your labs back.   Keep the diet clean and stay active.  Please get me a copy of your advanced directive form at your convenience.   Ice/cold pack over area for 10-15 min twice daily.  OK to take Tylenol 1000 mg (2 extra strength tabs) or 975 mg (3 regular strength tabs) every 6 hours as needed.  Send me a message in a few weeks if no better.   Let us know if you need anything.

## 2022-01-02 NOTE — Progress Notes (Signed)
Chief Complaint  Patient presents with   Annual Exam    Well Male Trevor Lewis is here for a complete physical.   His last physical was >1 year ago.  Current diet: in general, diet could be better.    Current exercise: running/walking Weight trend: stable Fatigue out of ordinary? No. Seat belt? Yes.   Advanced directive? No  Health maintenance Tetanus- Due HIV- Yes Hep C- Yes  Past Medical History:  Diagnosis Date   Carpal tunnel syndrome 02/17/2016   GERD (gastroesophageal reflux disease) 02/17/2016   Hypertension    Pericardial effusion    TB lung, latent      Past Surgical History:  Procedure Laterality Date   COLONOSCOPY  2013?   PERICARDIOCENTESIS     UPPER GASTROINTESTINAL ENDOSCOPY  2013   hiatal hernia    Medications  Current Outpatient Medications on File Prior to Visit  Medication Sig Dispense Refill   acetaminophen (TYLENOL) 500 MG tablet Take 1,000 mg by mouth every 6 (six) hours as needed for mild pain.     amLODIPine-Valsartan-HCTZ 10-320-25 MG TABS TAKE 1 TABLET BY MOUTH EVERY DAY 90 tablet 1   atorvastatin (LIPITOR) 40 MG tablet Take 1 tablet (40 mg total) by mouth daily. 90 tablet 2   calcium carbonate (TUMS EX) 750 MG chewable tablet Chew 1 tablet by mouth daily as needed for heartburn.     carvedilol (COREG) 25 MG tablet Take 1 tablet (25 mg total) by mouth 2 (two) times daily with a meal. 180 tablet 2   celecoxib (CELEBREX) 100 MG capsule Take 1 capsule (100 mg total) by mouth 2 (two) times daily. 30 capsule 0   metFORMIN (GLUCOPHAGE) 500 MG tablet TAKE 1 TABLET (500 MG TOTAL) BY MOUTH 2 (TWO) TIMES DAILY WITH A MEAL. 180 tablet 2   pantoprazole (PROTONIX) 40 MG tablet Take 1 tablet (40 mg total) by mouth daily before breakfast. 90 tablet 2   Allergies No Known Allergies  Family History Family History  Problem Relation Age of Onset   Colon cancer Neg Hx    Esophageal cancer Neg Hx     Review of Systems: Constitutional: no fevers or  chills Eye:  no recent significant change in vision Ear/Nose/Mouth/Throat:  Ears:  no hearing loss Nose/Mouth/Throat:  no complaints of nasal congestion, no sore throat Cardiovascular:  no chest pain Respiratory:  no shortness of breath Gastrointestinal:  no abdominal pain, no change in bowel habits GU:  Male: negative for dysuria, frequency, and incontinence Musculoskeletal/Extremities:  no pain of the joints Integumentary (Skin/Breast):  no abnormal skin lesions reported Neurologic:  no headaches Endocrine: No unexpected weight changes Hematologic/Lymphatic:  no night sweats  Exam BP 124/88 (BP Location: Left Arm, Patient Position: Sitting, Cuff Size: Large)   Pulse 64   Temp 97.9 F (36.6 C) (Oral)   Ht 5\' 11"  (1.803 m)   Wt 235 lb (106.6 kg)   SpO2 94%   BMI 32.78 kg/m  General:  well developed, well nourished, in no apparent distress Skin:  no significant moles, warts, or growths Head:  no masses, lesions, or tenderness Eyes:  pupils equal and round, sclera anicteric without injection Ears:  canals without lesions, TMs shiny without retraction, no obvious effusion, no erythema Nose:  nares patent, mucosa normal Throat/Pharynx:  lips and gingiva without lesion; tongue and uvula midline; non-inflamed pharynx; no exudates or postnasal drainage Neck: neck supple without adenopathy, thyromegaly, or masses Lungs:  clear to auscultation, breath sounds equal bilaterally, no respiratory  distress Cardio:  regular rate and rhythm, no bruits, no LE edema Abdomen:  abdomen soft, nontender; bowel sounds normal; no masses or organomegaly Rectal: Deferred Musculoskeletal: mild ttp over PIP jt on L 5th pinky toe, no deformity or edema, excessive warmth, otherwise symmetrical muscle groups noted without atrophy or deformity Extremities:  no clubbing, cyanosis, or edema, no deformities, no skin discoloration Neuro:  gait normal; deep tendon reflexes normal and symmetric Psych: well oriented  with normal range of affect and appropriate judgment/insight  Assessment and Plan  Well adult exam - Plan: CBC, Comprehensive metabolic panel, Lipid panel  Type 2 diabetes mellitus with hyperglycemia, without long-term current use of insulin (HCC) - Plan: Hemoglobin A1c  Pain of toe of left foot - Plan: Uric acid   Well 41 y.o. male. Counseled on diet and exercise. Counseled on risks and benefits of prostate cancer screening with PSA. The patient agrees to forego screening. Toe pain: Ck urate, tylenol, ice, Mobic. If no improvement, will refer to sports med.   Tdap declined.  Other orders as above. Follow up in 3-6 pending the above workup. The patient voiced understanding and agreement to the plan.  Jilda Roche [place], DO 01/02/22 9:54 AM

## 2022-01-03 ENCOUNTER — Other Ambulatory Visit: Payer: Self-pay | Admitting: Family Medicine

## 2022-01-03 MED ORDER — DAPAGLIFLOZIN PROPANEDIOL 10 MG PO TABS: 10.0000 mg | ORAL_TABLET | Freq: Every day | ORAL | 3 refills | Status: AC

## 2022-04-06 ENCOUNTER — Ambulatory Visit: Payer: Commercial Managed Care - HMO | Admitting: Family Medicine

## 2022-04-06 ENCOUNTER — Telehealth: Payer: Self-pay

## 2022-04-06 NOTE — Telephone Encounter (Signed)
PA approved.   Request Reference Number: [id number]. FARXIGA TAB '10MG'$  is approved through 04/06/2023. For further questions, call [employer] at [phone].

## 2022-04-06 NOTE — Telephone Encounter (Signed)
PA initiated via Covermymeds; KEY: [id number]. Awaiting determination.

## 2022-04-06 NOTE — Telephone Encounter (Signed)
Patient informed of approval.  

## 2022-06-27 ENCOUNTER — Other Ambulatory Visit: Payer: Self-pay | Admitting: Family Medicine

## 2022-06-27 DIAGNOSIS — I10 Essential (primary) hypertension: Secondary | ICD-10-CM

## 2022-06-28 ENCOUNTER — Other Ambulatory Visit: Payer: Self-pay | Admitting: Family Medicine

## 2023-01-07 ENCOUNTER — Other Ambulatory Visit: Payer: Self-pay | Admitting: Family Medicine

## 2023-01-21 ENCOUNTER — Other Ambulatory Visit: Payer: Self-pay | Admitting: Family Medicine

## 2023-03-18 ENCOUNTER — Ambulatory Visit (INDEPENDENT_AMBULATORY_CARE_PROVIDER_SITE_OTHER): Payer: Medicaid Other | Admitting: Medical

## 2023-03-18 ENCOUNTER — Ambulatory Visit: Payer: Self-pay | Admitting: Family Medicine

## 2023-03-18 VITALS — BP 134/79 | HR 70 | Temp 98.7°F | Resp 16 | Ht 71.0 in | Wt 230.0 lb

## 2023-03-18 DIAGNOSIS — G5603 Carpal tunnel syndrome, bilateral upper limbs: Secondary | ICD-10-CM

## 2023-03-18 DIAGNOSIS — M545 Low back pain, unspecified: Secondary | ICD-10-CM

## 2023-03-18 DIAGNOSIS — M5410 Radiculopathy, site unspecified: Secondary | ICD-10-CM | POA: Diagnosis not present

## 2023-03-18 MED ORDER — HYDROCODONE-ACETAMINOPHEN 5-325 MG PO TABS: 1.0000 | ORAL_TABLET | Freq: Four times a day (QID) | ORAL | 0 refills | Status: DC | PRN

## 2023-03-18 MED ORDER — DICLOFENAC SODIUM 75 MG PO TBEC: 75.0000 mg | DELAYED_RELEASE_TABLET | Freq: Two times a day (BID) | ORAL | 0 refills | Status: AC

## 2023-03-18 MED ORDER — CYCLOBENZAPRINE HCL 5 MG PO TABS: 5.0000 mg | ORAL_TABLET | Freq: Every day | ORAL | 0 refills | Status: DC

## 2023-03-18 NOTE — Progress Notes (Signed)
 Subjective:    Patient ID: Trevor Lewis, male    DOB: 02-06-1980, 43 y.o.   MRN: [id number]  HPI   Pt in with back pain for 6 days.   No strenuous activity or injury prior to onset of the pain. Pt states pain is severe. In the past he will only have self limited brief 1-2 day pain at worse in the past.   This pain is different than in the past.   At rest pain is lower level pain. But on movement pain is worse. When moves, bends over or twist 8-9/10 pain. Some pain that radiates down rt leg when he bends over or twist.  No med use for pain. No saddle anesthesia. No urinary incontinence. Normal stools.    Pt also states some tinlging sensation  in his hands at night. Tingling to both hands at times mostly at night. He has to shake his hands and tingling will stop. Only happening last 2 nights. No associated neck pain. No upper ext radicular pain.    Review of Systems  Constitutional:  Negative for diaphoresis, fatigue and fever.  Respiratory:  Negative for cough, chest tightness and wheezing.   Cardiovascular:  Negative for chest pain and palpitations.  Gastrointestinal:  Negative for abdominal pain.  Musculoskeletal:  Positive for back pain. Negative for arthralgias, joint swelling, neck pain and neck stiffness.  Neurological:  Negative for dizziness, tremors and weakness.  Hematological:  Negative for adenopathy. Does not bruise/bleed easily.    Past Medical History:  Diagnosis Date   Carpal tunnel syndrome 02/17/2016   GERD (gastroesophageal reflux disease) 02/17/2016   Hypertension    Pericardial effusion    TB lung, latent      Social History   Socioeconomic History   Marital status: Married    Spouse name: Not on file   Number of children: Not on file   Years of education: Not on file   Highest education level: Not on file  Occupational History   Not on file  Tobacco Use   Smoking status: Never   Smokeless tobacco: Never  Substance and Sexual Activity    Alcohol use: No   Drug use: No   Sexual activity: Not on file  Other Topics Concern   Not on file  Social History Narrative   Married, originally from [place]. In the [place] since 2006 or 2007. One son to daughters. No caffeine no alcohol no tobacco. No drug use.   03/04/2016   Social Drivers of Health   Financial Resource Strain: Not on file  Food Insecurity: Not on file  Transportation Needs: Not on file  Physical Activity: Not on file  Stress: Not on file  Social Connections: Not on file  Intimate Partner Violence: Not on file    Past Surgical History:  Procedure Laterality Date   COLONOSCOPY  2013?   PERICARDIOCENTESIS     UPPER GASTROINTESTINAL ENDOSCOPY  2013   hiatal hernia    Family History  Problem Relation Age of Onset   Colon cancer Neg Hx    Esophageal cancer Neg Hx     No Known Allergies  Current Outpatient Medications on File Prior to Visit  Medication Sig Dispense Refill   acetaminophen (TYLENOL) 500 MG tablet Take 1,000 mg by mouth every 6 (six) hours as needed for mild pain.     amLODIPine-Valsartan-HCTZ 10-320-25 MG TABS TAKE 1 TABLET BY MOUTH EVERY DAY 90 tablet 2   atorvastatin (LIPITOR) 40 MG tablet Take 1 tablet (  40 mg total) by mouth daily. 90 tablet 2   calcium carbonate (TUMS EX) 750 MG chewable tablet Chew 1 tablet by mouth daily as needed for heartburn.     carvedilol (COREG) 25 MG tablet Take 1 tablet (25 mg total) by mouth 2 (two) times daily with a meal. 180 tablet 2   dapagliflozin propanediol (FARXIGA) 10 MG TABS tablet Take 1 tablet (10 mg total) by mouth daily before breakfast. 30 tablet 3   metFORMIN (GLUCOPHAGE) 500 MG tablet TAKE 1 TABLET BY MOUTH 2 TIMES DAILY WITH A MEAL. 180 tablet 1   pantoprazole (PROTONIX) 40 MG tablet TAKE 1 TABLET BY MOUTH DAILY BEFORE BREAKFAST 30 tablet 8   No current facility-administered medications on file prior to visit.    BP 134/79 (BP Location: Left Arm, Patient Position: Sitting)   Pulse 70   Temp 98.7  F (37.1 C) (Oral)   Resp 16   Ht 5\' 11"  (1.803 m)   Wt 230 lb (104.3 kg)   SpO2 97%   BMI 32.08 kg/m        Objective:   Physical Exam  General Appearance- Not in acute distress.    Chest and Lung Exam Auscultation: Breath sounds:-Normal. Clear even and unlabored. Adventitious sounds:- No Adventitious sounds.  Cardiovascular Auscultation:Rythm - Regular, rate and rythm. Heart Sounds -Normal heart sounds.  Abdomen Inspection:-Inspection Normal.  Palpation/Perucssion: Palpation and Percussion of the abdomen reveal- Non Tender, No Rebound tenderness, No rigidity(Guarding) and No Palpable abdominal masses.  Liver:-Normal.  Spleen:- Normal.   Back Mid lumbar spine tenderness to palpation. Rt paralumbar tenderness to palpation Pain on straight leg lift. Pain on lateral movements and flexion/extension of the spine.  Lower ext neurologic  L5-S1 sensation intact bilaterally. Normal patellar reflexes bilaterally. No foot drop bilaterally.   Upper ext-  negative phalens and tinnel bilaterally.    Assessment & Plan:   .Acute Lumbar Pain with Radicular Symptoms Recent onset of high level lumbar pain with radicular pain towards the lateral calf area. Pain on palpation of muscles adjacent to the spine in the lumbar area. -Start Diclofenac 75mg  BID for 10 days. -Start Flexeril 5mg  at night as needed for the next 3 nights. -Start Norco 5-325mg  every 6 hours as needed for severe pain. Cautioned against operating heavy machinery or driving while on this medication. -Perform back stretching exercises as tolerated once pain level decreases. -Order standing lumbar x-ray to be done around 03/23/2023 or 03/24/2023 if pain persists.  Possible Carpal Tunnel Syndrome Hand tingling without associated neck pain. -If neck pain develops, notify physician for possible cervical spine x-ray.  Follow-up in 10-14 days with Dr. Carmelia Roller or myself if any residual pain. If pain resolves  completely, patient to send a MyChart message to update the team.   Esperanza Richters, PA-C

## 2023-03-18 NOTE — Patient Instructions (Addendum)
 Acute Lumbar Pain with Radicular Symptoms Recent onset of high level lumbar pain with radicular pain towards the lateral calf area. Pain on palpation of muscles adjacent to the spine in the lumbar area. -Start Diclofenac 75mg  BID for 10 days. -Start Flexeril 5mg  at night as needed for the next 3 nights. -Start Norco 5-325mg  every 6 hours as needed for severe pain. Cautioned against operating heavy machinery or driving while on this medication. -Perform back stretching exercises as tolerated once pain level decreases. -Order standing lumbar x-ray to be done around 03/23/2023 or 03/24/2023 if pain persists.  Possible Carpal Tunnel Syndrome Hand tingling without associated neck pain. -If neck pain develops, notify physician for possible cervical spine x-ray.  Follow-up in 10-14 days with Dr. Carmelia Roller or myself if any residual pain. If pain resolves completely, patient to send a MyChart message to update the team.  Back Exercises These exercises help to make your trunk and back strong. They also help to keep the lower back flexible. Doing these exercises can help to prevent or lessen pain in your lower back. If you have back pain, try to do these exercises 2-3 times each day or as told by your doctor. As you get better, do the exercises once each day. Repeat the exercises more often as told by your doctor. To stop back pain from coming back, do the exercises once each day, or as told by your doctor. Do exercises exactly as told by your doctor. Stop right away if you feel sudden pain or your pain gets worse. Exercises Single knee to chest Do these steps 3-5 times in a row for each leg: Lie on your back on a firm bed or the floor with your legs stretched out. Bring one knee to your chest. Grab your knee or thigh with both hands and hold it in place. Pull on your knee until you feel a gentle stretch in your lower back or butt. Keep doing the stretch for 10-30 seconds. Slowly let go of your leg and  straighten it. Pelvic tilt Do these steps 5-10 times in a row: Lie on your back on a firm bed or the floor with your legs stretched out. Bend your knees so they point up to the ceiling. Your feet should be flat on the floor. Tighten your lower belly (abdomen) muscles to press your lower back against the floor. This will make your tailbone point up to the ceiling instead of pointing down to your feet or the floor. Stay in this position for 5-10 seconds while you gently tighten your muscles and breathe evenly. Cat-cow Do these steps until your lower back bends more easily: Get on your hands and knees on a firm bed or the floor. Keep your hands under your shoulders, and keep your knees under your hips. You may put padding under your knees. Let your head hang down toward your chest. Tighten (contract) the muscles in your belly. Point your tailbone toward the floor so your lower back becomes rounded like the back of a cat. Stay in this position for 5 seconds. Slowly lift your head. Let the muscles of your belly relax. Point your tailbone up toward the ceiling so your back forms a sagging arch like the back of a cow. Stay in this position for 5 seconds.  Press-ups Do these steps 5-10 times in a row: Lie on your belly (face-down) on a firm bed or the floor. Place your hands near your head, about shoulder-width apart. While you keep your  back relaxed and keep your hips on the floor, slowly straighten your arms to raise the top half of your body and lift your shoulders. Do not use your back muscles. You may change where you place your hands to make yourself more comfortable. Stay in this position for 5 seconds. Keep your back relaxed. Slowly return to lying flat on the floor.  Bridges Do these steps 10 times in a row: Lie on your back on a firm bed or the floor. Bend your knees so they point up to the ceiling. Your feet should be flat on the floor. Your arms should be flat at your sides, next to  your body. Tighten your butt muscles and lift your butt off the floor until your waist is almost as high as your knees. If you do not feel the muscles working in your butt and the back of your thighs, slide your feet 1-2 inches (2.5-5 cm) farther away from your butt. Stay in this position for 3-5 seconds. Slowly lower your butt to the floor, and let your butt muscles relax. If this exercise is too easy, try doing it with your arms crossed over your chest. Belly crunches Do these steps 5-10 times in a row: Lie on your back on a firm bed or the floor with your legs stretched out. Bend your knees so they point up to the ceiling. Your feet should be flat on the floor. Cross your arms over your chest. Tip your chin a little bit toward your chest, but do not bend your neck. Tighten your belly muscles and slowly raise your chest just enough to lift your shoulder blades a tiny bit off the floor. Avoid raising your body higher than that because it can put too much stress on your lower back. Slowly lower your chest and your head to the floor. Back lifts Do these steps 5-10 times in a row: Lie on your belly (face-down) with your arms at your sides, and rest your forehead on the floor. Tighten the muscles in your legs and your butt. Slowly lift your chest off the floor while you keep your hips on the floor. Keep the back of your head in line with the curve in your back. Look at the floor while you do this. Stay in this position for 3-5 seconds. Slowly lower your chest and your face to the floor. Contact a doctor if: Your back pain gets a lot worse when you do an exercise. Your back pain does not get better within 2 hours after you exercise. If you have any of these problems, stop doing the exercises. Do not do them again unless your doctor says it is okay. Get help right away if: You have sudden, very bad back pain. If this happens, stop doing the exercises. Do not do them again unless your doctor says  it is okay. This information is not intended to replace advice given to you by your health care provider. Make sure you discuss any questions you have with your health care provider. Document Revised: 03/27/2020 Document Reviewed: 03/27/2020 Elsevier Patient Education  2024 [employer].

## 2023-03-18 NOTE — Telephone Encounter (Signed)
 Chief Complaint: Back pain Symptoms: back pain Frequency: Constant Pertinent Negatives: Patient denies Fever, n/v, weakness/numbness, bladder/bowel control problem, injury, overuse. Disposition: [] ED /[] Urgent Care (no appt availability in office) / [x] Appointment(In office/virtual)/ []  [facility]/ [] Home Care/ [] Refused Recommended Disposition /[]  Mobile Bus/ []  Follow-up with PCP Additional Notes: Patient called with complaints of lower back pain that started Friday. Cause of symptoms unknown but are severe and patient states he cannot sleep or do normal tasks. Patient states he has taken tylenol and motrin for pain relief with only little affect for a small amount of time. Pain returns with movement and if taken during activity medication is not effective. Patient states that pain radiates up back when bending over and will sometimes feel tingling down right leg with the movement that lasts a couple of minutes before disappearing after straightening back. Patient also states that abdominal pain is occasional when pressing in, abdomen feels soft and normal. Patient denies injury, overuse, numbness/weakness, swelling, n/v, or fever. Patient advised by this RN to be seen within the next 4 hours to which patient was agreeable. Appt scheduled. Patient advised by this RN to call back with worsening symptoms. Patient verbalized understanding   Copied from CRM [id number]. Topic: Clinical - Red Word Triage >> Mar 18, 2023 12:27 PM Deaijah H wrote: Red Word that prompted transfer to Nurse Triage: Really bad back pain, feels uncomfortable doing any movement Reason for Disposition  [1] SEVERE back pain (e.g., excruciating, unable to do any normal activities) AND [2] not improved 2 hours after pain medicine  Answer Assessment - Initial Assessment Questions 1. ONSET: "When did the pain begin?"      Friday 2. LOCATION: "Where does it hurt?" (upper, mid or lower back)     Lower back 3.  SEVERITY: "How bad is the pain?"  (e.g., Scale 1-10; mild, moderate, or severe)   - MILD (1-3): Doesn't interfere with normal activities.    - MODERATE (4-7): Interferes with normal activities or awakens from sleep.    - SEVERE (8-10): Excruciating pain, unable to do any normal activities.      Severe "I can't do the normal things and I couldn't sleep because of it." 4. PATTERN: "Is the pain constant?" (e.g., yes, no; constant, intermittent)      Constant 5. RADIATION: "Does the pain shoot into your legs or somewhere else?"     "Upper back sometimes with bending over" 6. CAUSE:  "What do you think is causing the back pain?"      "I don't know" 7. BACK OVERUSE:  "Any recent lifting of heavy objects, strenuous work or exercise?"     Denies 8. MEDICINES: "What have you taken so far for the pain?" (e.g., nothing, acetaminophen, NSAIDS)     Tylenol, motrin - "It goes away for a little while but it starts to hurt again with movement and sometimes if I take it and Im still doing the normal things it will still hurt" 9. NEUROLOGIC SYMPTOMS: "Do you have any weakness, numbness, or problems with bowel/bladder control?"     "Sometimes tingling in my right leg and it comes on if I bent over. It only lasts for a couple minutes and then it will go away if I straighten out." 10. OTHER SYMPTOMS: "Do you have any other symptoms?" (e.g., fever, abdomen pain, burning with urination, blood in urine)       Abdomen "it happens when I press down on it it sometimes. It burns. My abdomen  feels normal it's not hard."  Protocols used: Back Pain-A-AH

## 2023-05-09 ENCOUNTER — Other Ambulatory Visit: Payer: Self-pay | Admitting: Family Medicine

## 2023-05-09 DIAGNOSIS — I10 Essential (primary) hypertension: Secondary | ICD-10-CM

## 2023-06-09 ENCOUNTER — Ambulatory Visit: Payer: Self-pay

## 2023-06-09 LAB — LAB REPORT - SCANNED: EGFR (African American): 67

## 2023-06-09 NOTE — Telephone Encounter (Signed)
FYI. Pt going to ED.  

## 2023-06-09 NOTE — Telephone Encounter (Signed)
  Chief Complaint: cough, SOB Symptoms: productive cough, hemoptysis, SOB, runny nose Frequency: 3 days Pertinent Negatives: Patient denies fever, CP Disposition: [x] ED /[] Urgent Care (no appt availability in office) / [] Appointment(In office/virtual)/ []  [facility]/ [] Home Care/ [] Refused Recommended Disposition /[] [place] [facility]/ []  Follow-up with PCP Additional Notes: Pt states that he has been coughing and experiencing SOB for about 3 days and it is getting worse and worse. Pt denies long trips. Pt states that he is SOB at rest. Advised ED. SPO2 95% today, but pt states that 2 days ago it was 69%.   Reason for Disposition  [1] MODERATE difficulty breathing (e.g., speaks in phrases, SOB even at rest, pulse 100-120) AND [2] NEW-onset or WORSE than normal  Answer Assessment - Initial Assessment Questions 1. RESPIRATORY STATUS: "Describe your breathing?" (e.g., wheezing, shortness of breath, unable to speak, severe coughing)      Sob, wheezing 2. ONSET: "When did this breathing problem begin?"      3 days ago 3. PATTERN "Does the difficult breathing come and go, or has it been constant since it started?"      Constant, getting worse 4. SEVERITY: "How bad is your breathing?" (e.g., mild, moderate, severe)    - MILD: No SOB at rest, mild SOB with walking, speaks normally in sentences, can lie down, no retractions, pulse < 100.    - MODERATE: SOB at rest, SOB with minimal exertion and prefers to sit, cannot lie down flat, speaks in phrases, mild retractions, audible wheezing, pulse 100-120.    - SEVERE: Very SOB at rest, speaks in single words, struggling to breathe, sitting hunched forward, retractions, pulse > 120      moderate 5. RECURRENT SYMPTOM: "Have you had difficulty breathing before?" If Yes, ask: "When was the last time?" and "What happened that time?"      denies 6. CARDIAC HISTORY: "Do you have any history of heart disease?" (e.g., heart attack, angina, bypass  surgery, angioplasty)      Pericardial infusion 7. LUNG HISTORY: "Do you have any history of lung disease?"  (e.g., pulmonary embolus, asthma, emphysema)     denies 8. CAUSE: "What do you think is causing the breathing problem?"      unsure 9. OTHER SYMPTOMS: "Do you have any other symptoms? (e.g., dizziness, runny nose, cough, chest pain, fever)     Runny nose,  10. O2 SATURATION MONITOR:  "Do you use an oxygen saturation monitor (pulse oximeter) at home?" If Yes, ask: "What is your reading (oxygen level) today?" "What is your usual oxygen saturation reading?" (e.g., 95%)       95% 12. TRAVEL: "Have you traveled out of the country in the last month?" (e.g., travel history, exposures)       denies  Protocols used: Breathing Difficulty-A-AH

## 2023-09-02 ENCOUNTER — Other Ambulatory Visit: Payer: Self-pay | Admitting: Family Medicine

## 2024-01-05 ENCOUNTER — Ambulatory Visit: Payer: Self-pay

## 2024-01-05 NOTE — Telephone Encounter (Signed)
 Appt scheduled

## 2024-01-05 NOTE — Telephone Encounter (Signed)
°  FYI Only or Action Required?: FYI only for provider: appointment scheduled on 12/11.  Patient was last seen in primary care on 03/18/2023 by Dorina Loving, PA-C.  Called Nurse Triage reporting Cough.  Symptoms began several days ago.  Interventions attempted: OTC medications: Tylenol , DayQuil.  Symptoms are: unchanged.  Triage Disposition: See HCP Within 4 Hours (Or PCP Triage)  Patient/caregiver understands and will follow disposition?: Yes           Copied from CRM #[id number]. Topic: Clinical - Red Word Triage >> Jan 05, 2024  2:42 PM Ashley R wrote: Red Word that prompted transfer to Nurse Triage: Extreme headache, cold, throat, pain in back, abdominal and joints   ----------------------------------------------------------------------- From previous Reason for Contact - Scheduling: Patient/patient representative is calling to schedule an appointment. Refer to attachments for appointment information. Reason for Disposition  [1] MILD difficulty breathing (e.g., minimal/no SOB at rest, SOB with walking, pulse < 100) AND [2] still present when not coughing  Answer Assessment - Initial Assessment Questions 1. ONSET: When did the cough begin?      X 2 days    2. SEVERITY: How bad is the cough today?      Severe cough     3. SPUTUM: Describe the color of your sputum (e.g., none, dry cough; clear, white, yellow, green)     Clear     4. HEMOPTYSIS: Are you coughing up any blood? If Yes, ask: How much? (e.g., flecks, streaks, tablespoons, etc.)     No     5. DIFFICULTY BREATHING: Are you having difficulty breathing? If Yes, ask: How bad is it? (e.g., mild, moderate, severe)      Mild when he is coughing constantly      6. FEVER: Do you have a fever? If Yes, ask: What is your temperature, how was it measured, and when did it start?      Yes, patient stated last temp. Was 100.9 this morning     7. CARDIAC HISTORY: Do you have any history  of heart disease? (e.g., heart attack, congestive heart failure)       HTN   8. LUNG HISTORY: Do you have any history of lung disease?  (e.g., pulmonary embolus, asthma, emphysema)      No     10. OTHER SYMPTOMS: Do you have any other symptoms? (e.g., runny nose, wheezing, chest pain)       No     12. TRAVEL: Have you traveled out of the country in the last month? (e.g., travel history, exposures)       No     Patient called in to triage with complaints of headache, sore throat, cough, abdominal pain due to the cough, and SOB.  This has been ongoing for 3 days. The patient stated he feels as if he has a bad cold. He has not taken a Covid Test.  For home care, the patient is taking OTC Tylenol , DayQuil. Due to the SOB without coughing he was referred to the UC as there are no more available appointments this late in the afternoon. Patient requested the soonest appt. In office which he was scheduled for 12/11, and stated if his SOB worsened; he would go to UC as advised today. He agrees with the plan of care, and will reach out if symptoms worsen or persist.  Protocols used: Cough - Acute Productive-A-AH

## 2024-01-06 ENCOUNTER — Ambulatory Visit: Admitting: Student

## 2024-01-06 ENCOUNTER — Encounter: Payer: Self-pay | Admitting: Student

## 2024-01-06 VITALS — BP 131/80 | HR 88 | Temp 98.4°F | Ht 71.0 in | Wt 228.8 lb

## 2024-01-06 DIAGNOSIS — R051 Acute cough: Secondary | ICD-10-CM

## 2024-01-06 DIAGNOSIS — J111 Influenza due to unidentified influenza virus with other respiratory manifestations: Secondary | ICD-10-CM | POA: Insufficient documentation

## 2024-01-06 LAB — POCT INFLUENZA A/B
Influenza A, POC: POSITIVE — AB
Influenza B, POC: NEGATIVE

## 2024-01-06 LAB — POC COVID19 BINAXNOW: SARS Coronavirus 2 Ag: NEGATIVE

## 2024-01-06 MED ORDER — ALBUTEROL SULFATE HFA 108 (90 BASE) MCG/ACT IN AERS: 2.0000 | INHALATION_SPRAY | RESPIRATORY_TRACT | 0 refills | Status: AC | PRN

## 2024-01-06 MED ORDER — GUAIFENESIN ER 600 MG PO TB12: 600.0000 mg | ORAL_TABLET | Freq: Two times a day (BID) | ORAL | 0 refills | Status: AC

## 2024-01-06 MED ORDER — BENZONATATE 100 MG PO CAPS: 100.0000 mg | ORAL_CAPSULE | Freq: Three times a day (TID) | ORAL | 0 refills | Status: AC | PRN

## 2024-01-06 NOTE — Progress Notes (Signed)
 No chief complaint on file.   Trevor Lewis here for URI complaints.   History of Present Illness Trevor Lewis is a 43 year old male who presents with cough, fever, and muscle aches for 2 days. He has been around his son who recently tested positive for flu.  He has had a persistent cough and fever since recent close contact with his son, who was diagnosed with the flu. His fever reached 102F last night. The cough is worse with drinking water, especially cold water, and is uncomfortable.  He has muscle and joint aches that worsen when he coughs and feel like his whole body hurts.  He has shortness of breath. He has congestion and is coughing up mucus.  Patient denies chills, CP, palpitations, edema, HA, vision changes, N/V/D, abdominal pain, urinary symptoms, rash, weight changes   Past Medical History:  Diagnosis Date   Carpal tunnel syndrome 02/17/2016   GERD (gastroesophageal reflux disease) 02/17/2016   Hypertension    Pericardial effusion    TB lung, latent     Objective BP 131/80   Pulse 88   Temp 98.4 F (36.9 C)   Ht 5' 11 (1.803 m)   Wt 228 lb 12.8 oz (103.8 kg)   SpO2 95%   BMI 31.91 kg/m  General: Awake, alert, appears stated age HEENT: AT, [place], ears patent b/l and TM's neg, nares patent w/o discharge, pharynx pink and without exudates, MMM Neck: No masses or asymmetry Heart: RRR Lungs: CTAB, no accessory muscle use Psych: Age appropriate judgment and insight, normal mood and affect  Influenza - Plan: oseltamivir (TAMIFLU) 75 MG capsule, guaiFENesin (MUCINEX) 600 MG 12 hr tablet, albuterol  (VENTOLIN  HFA) 108 (90 Base) MCG/ACT inhaler  Acute cough - Plan: POC COVID-19 BinaxNow, POCT Influenza A/B, benzonatate (TESSALON) 100 MG capsule  Influenza with respiratory manifestations Influenza symptoms consistent with exposure. Discussed Tamiflu as an option. Emphasized hydration and symptom management. - Prescribed Tamiflu once daily for 7 days. - Recommended  Tylenol  and ibuprofen  every 4-6 hours for fever and myalgias. - Advised Mucinex for congestion, ensuring adequate hydration. - Prescribed Tessalon Perles up to three times a day for cough. - Encouraged increased fluid intake. -Continue to push fluids, practice good hand hygiene, cover mouth when coughing. F/u prn. If starting to experience fevers, shaking, or shortness of breath, seek immediate care. Pt voiced understanding and agreement to the plan.  Harlene LITTIE Jolly, DNP, AGNP-C 01/06/2024 3:53 PM
